# Patient Record
Sex: Female | Born: 1945 | Race: White | Hispanic: No | Marital: Married | Smoking: Former smoker
Health system: Southern US, Community
[De-identification: ages and names within clinical notes are randomized; demographics above are authoritative.]

## PROBLEM LIST (undated history)

## (undated) DIAGNOSIS — E739 Lactose intolerance, unspecified: Secondary | ICD-10-CM

## (undated) DIAGNOSIS — I340 Nonrheumatic mitral (valve) insufficiency: Secondary | ICD-10-CM

## (undated) DIAGNOSIS — I7300 Raynaud's syndrome without gangrene: Secondary | ICD-10-CM

## (undated) DIAGNOSIS — L659 Nonscarring hair loss, unspecified: Secondary | ICD-10-CM

## (undated) DIAGNOSIS — E039 Hypothyroidism, unspecified: Secondary | ICD-10-CM

## (undated) DIAGNOSIS — G35 Multiple sclerosis: Secondary | ICD-10-CM

## (undated) DIAGNOSIS — K589 Irritable bowel syndrome without diarrhea: Secondary | ICD-10-CM

## (undated) HISTORY — PX: CHOLECYSTECTOMY: SHX55

## (undated) HISTORY — PX: ABDOMINAL HYSTERECTOMY: SHX81

## (undated) HISTORY — PX: KNEE SURGERY: SHX244

## (undated) HISTORY — PX: TOTAL HIP ARTHROPLASTY: SHX124

---

## 2009-02-03 ENCOUNTER — Encounter: Admission: RE | Admit: 2009-02-03 | Discharge: 2009-02-03 | Payer: Self-pay | Admitting: Sports Medicine

## 2015-10-17 ENCOUNTER — Emergency Department (INDEPENDENT_AMBULATORY_CARE_PROVIDER_SITE_OTHER)
Admission: EM | Admit: 2015-10-17 | Discharge: 2015-10-17 | Disposition: A | Discharge status: Home / Self Care | Payer: Medicare Other | Source: Home / Self Care | Attending: Family Medicine | Admitting: Family Medicine

## 2015-10-17 ENCOUNTER — Encounter: Payer: Self-pay | Admitting: *Deleted

## 2015-10-17 DIAGNOSIS — N3001 Acute cystitis with hematuria: Secondary | ICD-10-CM

## 2015-10-17 DIAGNOSIS — R300 Dysuria: Secondary | ICD-10-CM | POA: Diagnosis not present

## 2015-10-17 HISTORY — DX: Lactose intolerance, unspecified: E73.9

## 2015-10-17 HISTORY — DX: Hypothyroidism, unspecified: E03.9

## 2015-10-17 HISTORY — DX: Multiple sclerosis: G35

## 2015-10-17 HISTORY — DX: Irritable bowel syndrome, unspecified: K58.9

## 2015-10-17 LAB — POCT URINALYSIS DIP (MANUAL ENTRY)
BILIRUBIN UA: NEGATIVE
BILIRUBIN UA: NEGATIVE
GLUCOSE UA: NEGATIVE
Nitrite, UA: NEGATIVE
PROTEIN UA: NEGATIVE
Spec Grav, UA: <=1.005 (ref 1.005–1.03)
Urobilinogen, UA: 0.2 (ref 0–1)
pH, UA: 6 (ref 5–8)

## 2015-10-17 MED ORDER — CEPHALEXIN 500 MG PO CAPS: 500.0000 mg | ORAL_CAPSULE | Freq: Two times a day (BID) | ORAL | Status: DC

## 2015-10-17 NOTE — Discharge Instructions (Signed)
Please take antibiotics as prescribed and be sure to complete entire course even if you start to feel better to ensure infection does not come back.  You may try over the counter medication called Azo to help with bladder spasms.  This medication can make your urine orange, which is normal.

## 2015-10-17 NOTE — ED Provider Notes (Signed)
CSN: [id number]     Arrival date & time 10/17/15  1908 History   First MD Initiated Contact with Patient 10/17/15 1928     Chief Complaint  Patient presents with  . Dysuria   (Consider location/radiation/quality/duration/timing/severity/associated sxs/prior Treatment) HPI  Anna Carpenter is a 70 y.o. female presenting to UC with c/o 3-4 days of having episode of IBS with related diarrhea and mild diffuse abdominal cramping.  She is also c/o bilateral lower back pain and cloudy urine for about 3-4 days.  Last UTI was a few months ago.  She does not recall the antibiotic she was taking last for her UTI.  She has been taking lomtil for her IBS with moderate relief but has not tried anything for her urine or lower back pain.  Pt concerned she may have a UTI but denies pain with urination. Denies hematuria. Denies fever, chills, n/v/d.    Past Medical History  Diagnosis Date  . Multiple sclerosis (HCC)   . Hypothyroidism   . Lactose intolerance   . IBS (irritable bowel syndrome)    Past Surgical History  Procedure Laterality Date  . Abdominal hysterectomy    . Cholecystectomy    . Knee surgery    . Total hip arthroplasty     Family History  Problem Relation Age of Onset  . Hypertension Mother    Social History  Substance Use Topics  . Smoking status: Never Smoker   . Smokeless tobacco: None  . Alcohol Use: No   OB History    No data available     Review of Systems  Constitutional: Negative for fever and chills.  Gastrointestinal: Positive for diarrhea. Negative for nausea, vomiting, abdominal pain, constipation and blood in stool.  Genitourinary: Positive for urgency and frequency. Negative for dysuria, hematuria, flank pain, decreased urine volume, vaginal bleeding, vaginal discharge and vaginal pain.       "cloudy urine"  Musculoskeletal: Positive for back pain. Negative for myalgias.    Allergies  Gabapentin; Macrobid; Nsaids; Sulfa antibiotics; and Tetracyclines &  related  Home Medications   Prior to Admission medications   Medication Sig Start Date End Date Taking? Authorizing Provider  aspirin 81 MG tablet Take 81 mg by mouth daily.   Yes Historical Provider, MD  Cholecalciferol (VITAMIN D PO) Take by mouth.   Yes Historical Provider, MD  Estradiol (VAGIFEM VA) Place vaginally.   Yes Historical Provider, MD  Interferon Beta-1a (REBIF [place]) Inject into the skin.   Yes Historical Provider, MD  IRON PO Take by mouth.   Yes Historical Provider, MD  levothyroxine (SYNTHROID, LEVOTHROID) 75 MCG tablet Take 75 mcg by mouth daily before breakfast.   Yes Historical Provider, MD  Magnesium 100 MG CAPS Take by mouth.   Yes Historical Provider, MD  omega-3 acid ethyl esters (LOVAZA) 1 g capsule Take by mouth 2 (two) times daily.   Yes Historical Provider, MD  Potassium 99 MG TABS Take by mouth.   Yes Historical Provider, MD  cephALEXin (KEFLEX) 500 MG capsule Take 1 capsule (500 mg total) by mouth 2 (two) times daily. For 7 days 10/17/15   Junius Finner, PA-C   Meds Ordered and Administered this Visit  Medications - No data to display  BP 169/79 mmHg  Pulse 57  Temp(Src) 97.7 F (36.5 C) (Oral)  Resp 16  Ht 5' 0.5" (1.537 m)  Wt 178 lb (80.74 kg)  BMI 34.18 kg/m2  SpO2 98% No data found.   Physical Exam  Constitutional:  She appears well-developed and well-nourished. No distress.  HENT:  Head: Normocephalic and atraumatic.  Mouth/Throat: Oropharynx is clear and moist.  Eyes: Conjunctivae are normal. No scleral icterus.  Neck: Normal range of motion.  Cardiovascular: Normal rate, regular rhythm and normal heart sounds.   Pulmonary/Chest: Effort normal and breath sounds normal. No respiratory distress. She has no wheezes. She has no rales.  Abdominal: Soft. She exhibits no distension and no mass. There is no tenderness. There is no rebound, no guarding and no CVA tenderness.  Musculoskeletal: Normal range of motion.  Neurological: She is alert.   Skin: Skin is warm and dry. She is not diaphoretic.  Nursing note and vitals reviewed.   ED Course  Procedures (including critical care time)  Labs Review Labs Reviewed  POCT URINALYSIS DIP (MANUAL ENTRY) - Abnormal; Notable for the following:    Blood, UA trace-intact (*)    Leukocytes, UA large (3+) (*)    All other components within normal limits  URINE CULTURE    Imaging Review No results found.    MDM   1. Dysuria   2. Acute cystitis with hematuria    Pt c/o lower back pain and darkened urine. Pt concerned for UTI.  UA c/w UTI. Will send culture.  Rx: keflex.  Encouraged to stay well hydrated.  F/u with PCP in 3-4 days if not improving, sooner if worsening. Patient verbalized understanding and agreement with treatment plan.    Junius Finner, PA-C 10/18/15 [phone]

## 2015-10-17 NOTE — ED Notes (Signed)
Pt c/o low back pain and cloudy urine x 3-4 days after having an episode of IBS related diarrhea. Denies dysuria or fever.

## 2015-10-19 ENCOUNTER — Telehealth: Payer: Self-pay | Admitting: Emergency Medicine

## 2015-10-19 LAB — URINE CULTURE: Colony Count: 15000

## 2015-10-19 NOTE — Telephone Encounter (Signed)
Hope she is feeling better, no bacteria to speak of in culture, finish meds and call if not getting better

## 2016-05-07 DIAGNOSIS — G35 Multiple sclerosis: Secondary | ICD-10-CM | POA: Diagnosis not present

## 2016-05-07 DIAGNOSIS — G2581 Restless legs syndrome: Secondary | ICD-10-CM | POA: Diagnosis not present

## 2016-05-07 DIAGNOSIS — R252 Cramp and spasm: Secondary | ICD-10-CM | POA: Diagnosis not present

## 2016-05-07 DIAGNOSIS — R5383 Other fatigue: Secondary | ICD-10-CM | POA: Diagnosis not present

## 2016-05-10 DIAGNOSIS — R9082 White matter disease, unspecified: Secondary | ICD-10-CM | POA: Diagnosis not present

## 2016-05-10 DIAGNOSIS — G35 Multiple sclerosis: Secondary | ICD-10-CM | POA: Diagnosis not present

## 2016-05-15 DIAGNOSIS — B373 Candidiasis of vulva and vagina: Secondary | ICD-10-CM | POA: Diagnosis not present

## 2016-05-17 DIAGNOSIS — R102 Pelvic and perineal pain: Secondary | ICD-10-CM | POA: Diagnosis not present

## 2016-06-07 DIAGNOSIS — G35 Multiple sclerosis: Secondary | ICD-10-CM | POA: Diagnosis not present

## 2016-07-04 DIAGNOSIS — G35 Multiple sclerosis: Secondary | ICD-10-CM | POA: Diagnosis not present

## 2016-07-04 DIAGNOSIS — G2581 Restless legs syndrome: Secondary | ICD-10-CM | POA: Diagnosis not present

## 2016-08-07 DIAGNOSIS — Z0000 Encounter for general adult medical examination without abnormal findings: Secondary | ICD-10-CM | POA: Diagnosis not present

## 2016-08-07 DIAGNOSIS — E782 Mixed hyperlipidemia: Secondary | ICD-10-CM | POA: Diagnosis not present

## 2016-08-07 DIAGNOSIS — E039 Hypothyroidism, unspecified: Secondary | ICD-10-CM | POA: Diagnosis not present

## 2016-08-29 DIAGNOSIS — D692 Other nonthrombocytopenic purpura: Secondary | ICD-10-CM | POA: Diagnosis not present

## 2016-09-20 DIAGNOSIS — N952 Postmenopausal atrophic vaginitis: Secondary | ICD-10-CM | POA: Diagnosis not present

## 2016-09-20 DIAGNOSIS — N760 Acute vaginitis: Secondary | ICD-10-CM | POA: Diagnosis not present

## 2016-09-25 DIAGNOSIS — G35 Multiple sclerosis: Secondary | ICD-10-CM | POA: Diagnosis not present

## 2016-10-04 DIAGNOSIS — R252 Cramp and spasm: Secondary | ICD-10-CM | POA: Diagnosis not present

## 2016-10-04 DIAGNOSIS — G2581 Restless legs syndrome: Secondary | ICD-10-CM | POA: Diagnosis not present

## 2016-10-04 DIAGNOSIS — R5383 Other fatigue: Secondary | ICD-10-CM | POA: Diagnosis not present

## 2016-10-04 DIAGNOSIS — G35 Multiple sclerosis: Secondary | ICD-10-CM | POA: Diagnosis not present

## 2016-10-05 ENCOUNTER — Emergency Department (INDEPENDENT_AMBULATORY_CARE_PROVIDER_SITE_OTHER)
Admission: EM | Admit: 2016-10-05 | Discharge: 2016-10-05 | Disposition: A | Discharge status: Home / Self Care | Payer: Medicare Other | Source: Home / Self Care | Attending: Family Medicine | Admitting: Family Medicine

## 2016-10-05 DIAGNOSIS — R300 Dysuria: Secondary | ICD-10-CM | POA: Diagnosis not present

## 2016-10-05 DIAGNOSIS — R310 Gross hematuria: Secondary | ICD-10-CM | POA: Diagnosis not present

## 2016-10-05 HISTORY — DX: Nonscarring hair loss, unspecified: L65.9

## 2016-10-05 HISTORY — DX: Raynaud's syndrome without gangrene: I73.00

## 2016-10-05 HISTORY — DX: Nonrheumatic mitral (valve) insufficiency: I34.0

## 2016-10-05 LAB — POCT URINALYSIS DIP (MANUAL ENTRY)
Bilirubin, UA: NEGATIVE
Glucose, UA: NEGATIVE mg/dL
Ketones, POC UA: NEGATIVE mg/dL
Nitrite, UA: NEGATIVE
Protein Ur, POC: NEGATIVE mg/dL
Spec Grav, UA: <=1.005 — AB (ref 1.010–1.025)
Urobilinogen, UA: 0.2 E.U./dL
pH, UA: 7 (ref 5.0–8.0)

## 2016-10-05 MED ORDER — CIPROFLOXACIN HCL 250 MG PO TABS: 250.0000 mg | ORAL_TABLET | Freq: Two times a day (BID) | ORAL | 0 refills | Status: DC

## 2016-10-05 NOTE — ED Triage Notes (Signed)
Pt stated that her urine was very dark today, and took a UTI home pregnancy test and it was positive.

## 2016-10-05 NOTE — Discharge Instructions (Signed)
° °  Please let your provider know that your symptoms and urine test in urgent care are consistent with a likely UTI (urinary tract infection) but not definite.    The urine showed Specific Gravity < = 1.005,  Blood= small, Leukocytes= Large, Nitrites= Negative  Your urine has been sent for further testing with a culture.  This will show [place] how many bacteria grow and make sure you are on the correct antibiotic.  Ciprofloxacin is a strong antibiotic for urinary infections.  If you develop joint or muscle pain or swelling, please stop this medication immediately and call our office.

## 2016-10-05 NOTE — ED Provider Notes (Signed)
CSN: [id number]     Arrival date & time 10/05/16  1722 History   First MD Initiated Contact with Patient 10/05/16 1740     Chief Complaint  Patient presents with  . Urinary Tract Infection   (Consider location/radiation/quality/duration/timing/severity/associated sxs/prior Treatment) HPI Anna Carpenter is a 71 y.o. female presenting to UC with c/o dark urine for about 2 days, even darker today than yesterday with mild lower abdominal discomfort.  Home UTI test was Positive.  Pt is concerned because she has a hx of MS and is suppose to get her second infusion of a new medication on Tuesday, 10/08/16 but she will not be able to get it if she has an infection. Denies fever, chills, n/v/d. Denies back pain. Pt notes she drinks at least 64oz water daily so she does not believe she is dehydrated. No hx of kidney problems. Last time she had a UTI several years ago there was blood in her urine.    Past Medical History:  Diagnosis Date  . Alopecia   . Hypothyroidism   . IBS (irritable bowel syndrome)   . Lactose intolerance   . Mitral valve regurgitation   . Multiple sclerosis (HCC)   . Raynaud's disease    Past Surgical History:  Procedure Laterality Date  . ABDOMINAL HYSTERECTOMY    . CHOLECYSTECTOMY    . KNEE SURGERY    . TOTAL HIP ARTHROPLASTY     Family History  Problem Relation Age of Onset  . Hypertension Mother    Social History  Substance Use Topics  . Smoking status: Never Smoker  . Smokeless tobacco: Not on file  . Alcohol use No   OB History    No data available     Review of Systems  Constitutional: Negative for chills and fever.  Gastrointestinal: Negative for nausea and vomiting.  Genitourinary: Positive for dysuria, frequency, hematuria and pelvic pain (mild suprapubic pain). Negative for decreased urine volume, flank pain, urgency, vaginal bleeding, vaginal discharge and vaginal pain.  Musculoskeletal: Negative for back pain and myalgias.    Allergies   Gabapentin; Macrobid [nitrofurantoin monohyd macro]; Nsaids; Sulfa antibiotics; Tetracyclines & related; and Thimerosal  Home Medications   Prior to Admission medications   Medication Sig Start Date End Date Taking? Authorizing Provider  aspirin 81 MG tablet Take 81 mg by mouth daily.    [provider]  cephALEXin (KEFLEX) 500 MG capsule Take 1 capsule (500 mg total) by mouth 2 (two) times daily. For 7 days 10/17/15   Lurene Shadow, PA-C  Cholecalciferol (VITAMIN D PO) Take by mouth.    [provider]  ciprofloxacin (CIPRO) 250 MG tablet Take 1 tablet (250 mg total) by mouth 2 (two) times daily. 10/05/16   Lurene Shadow, PA-C  Estradiol (VAGIFEM VA) Place vaginally.    [provider]  Interferon Beta-1a (REBIF [place]) Inject into the skin.    [provider]  IRON PO Take by mouth.    [provider]  levothyroxine (SYNTHROID, LEVOTHROID) 75 MCG tablet Take 75 mcg by mouth daily before breakfast.    [provider]  Magnesium 100 MG CAPS Take by mouth.    [provider]  omega-3 acid ethyl esters (LOVAZA) 1 g capsule Take by mouth 2 (two) times daily.    [provider]  Potassium 99 MG TABS Take by mouth.    [provider]   Meds Ordered and Administered this Visit  Medications - No data to display  BP (!) 168/77 (BP Location: Left Arm)   Pulse 63   Temp 97.6 F (36.4 C) (Oral)   Ht 5\' 4"  (1.626 m)   Wt 172 lb (78 kg)   SpO2 100%   BMI 29.52 kg/m  No data found.   Physical Exam  Constitutional: She is oriented to person, place, and time. She appears well-developed and well-nourished. No distress.  HENT:  Head: Normocephalic and atraumatic.  Mouth/Throat: Oropharynx is clear and moist.  Eyes: EOM are normal.  Neck: Normal range of motion.  Cardiovascular: Normal rate and regular rhythm.   Pulmonary/Chest: Effort normal and breath sounds normal. No respiratory distress. She has no wheezes. She  has no rales.  Abdominal: Soft. She exhibits no distension and no mass. There is tenderness in the suprapubic area. There is no rebound, no guarding and no CVA tenderness. No hernia.  Musculoskeletal: Normal range of motion.  Neurological: She is alert and oriented to person, place, and time.  Skin: Skin is warm and dry. She is not diaphoretic.  Psychiatric: She has a normal mood and affect. Her behavior is normal.  Nursing note and vitals reviewed.   Urgent Care Course     Procedures (including critical care time)  Labs Review Labs Reviewed  POCT URINALYSIS DIP (MANUAL ENTRY) - Abnormal; Notable for the following:       Result Value   Spec Grav, UA <=1.005 (*)    Blood, UA small (*)    Leukocytes, UA Large (3+) (*)    All other components within normal limits  URINE CULTURE    Imaging Review No results found.    MDM   1. Dysuria   2. Hematuria, gross    Hx and UA c/w mild UTI, will send culture. Due to new for urgent treatment of infection before Tuesday, will start pt on Cipro 250mg  BID for 3 days. Pt has had Cipro before w/o reaction.  Encouraged to let her provider know next week about today's visit as they may want to recheck her urine.  Pt understanding and agreeable with tx plan.     Lurene Shadow, [place] 10/05/16 [phone]

## 2016-10-05 NOTE — ED Notes (Signed)
Pt had a UTI home test, not a UTI home pregnancy test

## 2016-10-07 ENCOUNTER — Telehealth: Payer: Self-pay | Admitting: Emergency Medicine

## 2016-10-07 LAB — URINE CULTURE

## 2016-10-07 NOTE — Telephone Encounter (Signed)
Pt informed of urine culture results, will complete antbs as prescribed and if sxs do not resolve, follow up with PCP.  TMartin,CMA

## 2016-10-08 DIAGNOSIS — H53002 Unspecified amblyopia, left eye: Secondary | ICD-10-CM | POA: Diagnosis not present

## 2016-10-08 DIAGNOSIS — H01004 Unspecified blepharitis left upper eyelid: Secondary | ICD-10-CM | POA: Diagnosis not present

## 2016-10-08 DIAGNOSIS — H527 Unspecified disorder of refraction: Secondary | ICD-10-CM | POA: Diagnosis not present

## 2016-10-08 DIAGNOSIS — H25813 Combined forms of age-related cataract, bilateral: Secondary | ICD-10-CM | POA: Diagnosis not present

## 2016-10-08 DIAGNOSIS — H469 Unspecified optic neuritis: Secondary | ICD-10-CM | POA: Diagnosis not present

## 2016-10-09 DIAGNOSIS — G35 Multiple sclerosis: Secondary | ICD-10-CM | POA: Diagnosis not present

## 2016-12-31 DIAGNOSIS — G35 Multiple sclerosis: Secondary | ICD-10-CM | POA: Diagnosis not present

## 2016-12-31 DIAGNOSIS — G2581 Restless legs syndrome: Secondary | ICD-10-CM | POA: Diagnosis not present

## 2016-12-31 DIAGNOSIS — R5383 Other fatigue: Secondary | ICD-10-CM | POA: Diagnosis not present

## 2016-12-31 DIAGNOSIS — R252 Cramp and spasm: Secondary | ICD-10-CM | POA: Diagnosis not present

## 2017-01-02 DIAGNOSIS — Z23 Encounter for immunization: Secondary | ICD-10-CM | POA: Diagnosis not present

## 2017-02-05 DIAGNOSIS — N952 Postmenopausal atrophic vaginitis: Secondary | ICD-10-CM | POA: Diagnosis not present

## 2017-02-05 DIAGNOSIS — N898 Other specified noninflammatory disorders of vagina: Secondary | ICD-10-CM | POA: Diagnosis not present

## 2017-02-05 DIAGNOSIS — B373 Candidiasis of vulva and vagina: Secondary | ICD-10-CM | POA: Diagnosis not present

## 2017-02-07 DIAGNOSIS — Z0000 Encounter for general adult medical examination without abnormal findings: Secondary | ICD-10-CM | POA: Diagnosis not present

## 2017-02-07 DIAGNOSIS — E039 Hypothyroidism, unspecified: Secondary | ICD-10-CM | POA: Diagnosis not present

## 2017-02-07 DIAGNOSIS — E782 Mixed hyperlipidemia: Secondary | ICD-10-CM | POA: Diagnosis not present

## 2017-02-13 DIAGNOSIS — Z0000 Encounter for general adult medical examination without abnormal findings: Secondary | ICD-10-CM | POA: Diagnosis not present

## 2017-02-13 DIAGNOSIS — E782 Mixed hyperlipidemia: Secondary | ICD-10-CM | POA: Diagnosis not present

## 2017-02-13 DIAGNOSIS — E039 Hypothyroidism, unspecified: Secondary | ICD-10-CM | POA: Diagnosis not present

## 2017-02-13 DIAGNOSIS — Z23 Encounter for immunization: Secondary | ICD-10-CM | POA: Diagnosis not present

## 2017-02-25 DIAGNOSIS — Z1231 Encounter for screening mammogram for malignant neoplasm of breast: Secondary | ICD-10-CM | POA: Diagnosis not present

## 2017-03-06 DIAGNOSIS — N6092 Unspecified benign mammary dysplasia of left breast: Secondary | ICD-10-CM | POA: Diagnosis not present

## 2017-03-06 DIAGNOSIS — R5383 Other fatigue: Secondary | ICD-10-CM | POA: Diagnosis not present

## 2017-03-06 DIAGNOSIS — R928 Other abnormal and inconclusive findings on diagnostic imaging of breast: Secondary | ICD-10-CM | POA: Diagnosis not present

## 2017-03-06 DIAGNOSIS — G35 Multiple sclerosis: Secondary | ICD-10-CM | POA: Diagnosis not present

## 2017-03-06 DIAGNOSIS — G2581 Restless legs syndrome: Secondary | ICD-10-CM | POA: Diagnosis not present

## 2017-03-26 DIAGNOSIS — G35 Multiple sclerosis: Secondary | ICD-10-CM | POA: Diagnosis not present

## 2017-05-17 DIAGNOSIS — B373 Candidiasis of vulva and vagina: Secondary | ICD-10-CM | POA: Diagnosis not present

## 2017-06-06 DIAGNOSIS — G2581 Restless legs syndrome: Secondary | ICD-10-CM | POA: Diagnosis not present

## 2017-06-06 DIAGNOSIS — G35 Multiple sclerosis: Secondary | ICD-10-CM | POA: Diagnosis not present

## 2017-06-06 DIAGNOSIS — R5383 Other fatigue: Secondary | ICD-10-CM | POA: Diagnosis not present

## 2017-08-08 DIAGNOSIS — E782 Mixed hyperlipidemia: Secondary | ICD-10-CM | POA: Diagnosis not present

## 2017-08-08 DIAGNOSIS — E039 Hypothyroidism, unspecified: Secondary | ICD-10-CM | POA: Diagnosis not present

## 2017-08-08 DIAGNOSIS — M899 Disorder of bone, unspecified: Secondary | ICD-10-CM | POA: Diagnosis not present

## 2017-08-09 DIAGNOSIS — H01004 Unspecified blepharitis left upper eyelid: Secondary | ICD-10-CM | POA: Diagnosis not present

## 2017-08-09 DIAGNOSIS — H53002 Unspecified amblyopia, left eye: Secondary | ICD-10-CM | POA: Diagnosis not present

## 2017-08-09 DIAGNOSIS — H01001 Unspecified blepharitis right upper eyelid: Secondary | ICD-10-CM | POA: Diagnosis not present

## 2017-08-09 DIAGNOSIS — H43813 Vitreous degeneration, bilateral: Secondary | ICD-10-CM | POA: Diagnosis not present

## 2017-08-09 DIAGNOSIS — H527 Unspecified disorder of refraction: Secondary | ICD-10-CM | POA: Diagnosis not present

## 2017-08-09 DIAGNOSIS — H25813 Combined forms of age-related cataract, bilateral: Secondary | ICD-10-CM | POA: Diagnosis not present

## 2017-08-09 DIAGNOSIS — H469 Unspecified optic neuritis: Secondary | ICD-10-CM | POA: Diagnosis not present

## 2017-08-09 DIAGNOSIS — H52223 Regular astigmatism, bilateral: Secondary | ICD-10-CM | POA: Diagnosis not present

## 2017-08-14 DIAGNOSIS — E782 Mixed hyperlipidemia: Secondary | ICD-10-CM | POA: Diagnosis not present

## 2017-08-14 DIAGNOSIS — M25541 Pain in joints of right hand: Secondary | ICD-10-CM | POA: Diagnosis not present

## 2017-08-14 DIAGNOSIS — E039 Hypothyroidism, unspecified: Secondary | ICD-10-CM | POA: Diagnosis not present

## 2017-08-15 DIAGNOSIS — H25813 Combined forms of age-related cataract, bilateral: Secondary | ICD-10-CM | POA: Diagnosis not present

## 2017-08-15 DIAGNOSIS — G2581 Restless legs syndrome: Secondary | ICD-10-CM | POA: Diagnosis not present

## 2017-08-15 DIAGNOSIS — H52223 Regular astigmatism, bilateral: Secondary | ICD-10-CM | POA: Diagnosis not present

## 2017-08-15 DIAGNOSIS — H01001 Unspecified blepharitis right upper eyelid: Secondary | ICD-10-CM | POA: Diagnosis not present

## 2017-08-15 DIAGNOSIS — H43813 Vitreous degeneration, bilateral: Secondary | ICD-10-CM | POA: Diagnosis not present

## 2017-08-15 DIAGNOSIS — H53002 Unspecified amblyopia, left eye: Secondary | ICD-10-CM | POA: Diagnosis not present

## 2017-08-15 DIAGNOSIS — H469 Unspecified optic neuritis: Secondary | ICD-10-CM | POA: Diagnosis not present

## 2017-08-15 DIAGNOSIS — K219 Gastro-esophageal reflux disease without esophagitis: Secondary | ICD-10-CM | POA: Diagnosis not present

## 2017-08-15 DIAGNOSIS — H01004 Unspecified blepharitis left upper eyelid: Secondary | ICD-10-CM | POA: Diagnosis not present

## 2017-08-15 DIAGNOSIS — E039 Hypothyroidism, unspecified: Secondary | ICD-10-CM | POA: Diagnosis not present

## 2017-08-15 DIAGNOSIS — H527 Unspecified disorder of refraction: Secondary | ICD-10-CM | POA: Diagnosis not present

## 2017-08-15 DIAGNOSIS — H25812 Combined forms of age-related cataract, left eye: Secondary | ICD-10-CM | POA: Diagnosis not present

## 2017-08-15 DIAGNOSIS — G35 Multiple sclerosis: Secondary | ICD-10-CM | POA: Diagnosis not present

## 2017-08-15 DIAGNOSIS — H52222 Regular astigmatism, left eye: Secondary | ICD-10-CM | POA: Diagnosis not present

## 2017-08-16 DIAGNOSIS — Z961 Presence of intraocular lens: Secondary | ICD-10-CM | POA: Diagnosis not present

## 2017-08-20 DIAGNOSIS — Z961 Presence of intraocular lens: Secondary | ICD-10-CM | POA: Diagnosis not present

## 2017-08-30 DIAGNOSIS — H538 Other visual disturbances: Secondary | ICD-10-CM | POA: Diagnosis not present

## 2017-09-04 DIAGNOSIS — H538 Other visual disturbances: Secondary | ICD-10-CM | POA: Diagnosis not present

## 2017-09-04 DIAGNOSIS — Z961 Presence of intraocular lens: Secondary | ICD-10-CM | POA: Diagnosis not present

## 2017-09-12 DIAGNOSIS — H0100B Unspecified blepharitis left eye, upper and lower eyelids: Secondary | ICD-10-CM | POA: Diagnosis not present

## 2017-09-12 DIAGNOSIS — H2640 Unspecified secondary cataract: Secondary | ICD-10-CM | POA: Diagnosis not present

## 2017-09-12 DIAGNOSIS — Z961 Presence of intraocular lens: Secondary | ICD-10-CM | POA: Diagnosis not present

## 2017-09-12 DIAGNOSIS — H0100A Unspecified blepharitis right eye, upper and lower eyelids: Secondary | ICD-10-CM | POA: Diagnosis not present

## 2017-09-12 DIAGNOSIS — E039 Hypothyroidism, unspecified: Secondary | ICD-10-CM | POA: Diagnosis not present

## 2017-09-12 DIAGNOSIS — G35 Multiple sclerosis: Secondary | ICD-10-CM | POA: Diagnosis not present

## 2017-09-12 DIAGNOSIS — E782 Mixed hyperlipidemia: Secondary | ICD-10-CM | POA: Diagnosis not present

## 2017-09-27 DIAGNOSIS — Z961 Presence of intraocular lens: Secondary | ICD-10-CM | POA: Diagnosis not present

## 2017-09-27 DIAGNOSIS — H16223 Keratoconjunctivitis sicca, not specified as Sjogren's, bilateral: Secondary | ICD-10-CM | POA: Diagnosis not present

## 2017-09-30 DIAGNOSIS — G35 Multiple sclerosis: Secondary | ICD-10-CM | POA: Diagnosis not present

## 2017-10-17 ENCOUNTER — Encounter: Payer: Self-pay | Admitting: Sports Medicine

## 2017-10-17 ENCOUNTER — Ambulatory Visit (INDEPENDENT_AMBULATORY_CARE_PROVIDER_SITE_OTHER): Payer: Medicare Other

## 2017-10-17 ENCOUNTER — Ambulatory Visit (INDEPENDENT_AMBULATORY_CARE_PROVIDER_SITE_OTHER): Payer: Medicare Other | Admitting: Sports Medicine

## 2017-10-17 DIAGNOSIS — M25511 Pain in right shoulder: Principal | ICD-10-CM

## 2017-10-17 DIAGNOSIS — M47812 Spondylosis without myelopathy or radiculopathy, cervical region: Secondary | ICD-10-CM | POA: Insufficient documentation

## 2017-10-17 DIAGNOSIS — M5412 Radiculopathy, cervical region: Secondary | ICD-10-CM

## 2017-10-17 DIAGNOSIS — M4313 Spondylolisthesis, cervicothoracic region: Secondary | ICD-10-CM

## 2017-10-17 DIAGNOSIS — G35D Multiple sclerosis, unspecified: Secondary | ICD-10-CM | POA: Insufficient documentation

## 2017-10-17 DIAGNOSIS — G8929 Other chronic pain: Secondary | ICD-10-CM

## 2017-10-17 DIAGNOSIS — G35 Multiple sclerosis: Secondary | ICD-10-CM | POA: Diagnosis not present

## 2017-10-17 MED ORDER — DICLOFENAC SODIUM 2 % TD SOLN: 2.0000 | Freq: Two times a day (BID) | TRANSDERMAL | 11 refills | Status: DC

## 2017-10-17 NOTE — Progress Notes (Signed)
Subjective:    CC: Shoulder and neck pain  HPI:  Neck pain: Worse with prolonged downgaze, no radiation into the arms or fingertips, no progressive weakness, moderate, persistent with a grinding sensation.  Allergic to NSAIDs, desires to try more of a nonpharmacologic approach.  Shoulder pain: Anterior, popping sensations are common, worse with reaching forward.  No trauma, pain is chronic.  I reviewed the past medical history, family history, social history, surgical history, and allergies today and no changes were needed.  Please see the problem list section below in epic for further details.  Past Medical History: Past Medical History:  Diagnosis Date  . Alopecia   . Hypothyroidism   . IBS (irritable bowel syndrome)   . Lactose intolerance   . Mitral valve regurgitation   . Multiple sclerosis (HCC)   . Raynaud's disease    Past Surgical History: Past Surgical History:  Procedure Laterality Date  . ABDOMINAL HYSTERECTOMY    . CHOLECYSTECTOMY    . KNEE SURGERY    . TOTAL HIP ARTHROPLASTY     Social History: Social History   Socioeconomic History  . Marital status: Married    Spouse name: Not on file  . Number of children: Not on file  . Years of education: Not on file  . Highest education level: Not on file  Occupational History  . Not on file  Social Needs  . Financial resource strain: Not on file  . Food insecurity:    Worry: Not on file    Inability: Not on file  . Transportation needs:    Medical: Not on file    Non-medical: Not on file  Tobacco Use  . Smoking status: Former Games developer  . Smokeless tobacco: Never Used  Substance and Sexual Activity  . Alcohol use: No  . Drug use: No  . Sexual activity: Not Currently  Lifestyle  . Physical activity:    Days per week: Not on file    Minutes per session: Not on file  . Stress: Not on file  Relationships  . Social connections:    Talks on phone: Not on file    Gets together: Not on file    Attends  religious service: Not on file    Active member of club or organization: Not on file    Attends meetings of clubs or organizations: Not on file    Relationship status: Not on file  Other Topics Concern  . Not on file  Social History Narrative  . Not on file   Family History: Family History  Problem Relation Age of Onset  . Hypertension Mother   . Cancer Mother        Ovarian   Allergies: Allergies  Allergen Reactions  . Gabapentin   . Macrobid [employer] Macro]   . Nsaids   . Sulfa Antibiotics   . Tetracyclines & Related   . Thimerosal    Medications: See med rec.  Review of Systems: No headache, visual changes, nausea, vomiting, diarrhea, constipation, dizziness, abdominal pain, skin rash, fevers, chills, night sweats, swollen lymph nodes, weight loss, chest pain, body aches, joint swelling, muscle aches, shortness of breath, mood changes, visual or auditory hallucinations.  Objective:    General: Well Developed, well nourished, and in no acute distress.  Neuro: Alert and oriented x3, extra-ocular muscles intact, sensation grossly intact.  HEENT: Normocephalic, atraumatic, pupils equal round reactive to light, neck supple, no masses, no lymphadenopathy, thyroid nonpalpable.  Skin: Warm and dry, no rashes noted.  Cardiac: Regular rate and rhythm, no murmurs rubs or gallops.  Respiratory: Clear to auscultation bilaterally. Not using accessory muscles, speaking in full sentences.  Abdominal: Soft, nontender, nondistended, positive bowel sounds, no masses, no organomegaly.  Neck: Negative spurling's Full neck range of motion Grip strength and sensation normal in bilateral hands Strength good C4 to T1 distribution No sensory change to C4 to T1 Reflexes normal Right shoulder: Inspection reveals no abnormalities, atrophy or asymmetry. Tender to palpation over the bicipital groove. ROM is full in all planes. Rotator cuff strength normal throughout. No signs of  impingement with negative Neer and Hawkin's tests, empty can. Positive speeds and Yergason tests No labral pathology noted with negative Obrien's, negative crank, negative clunk, and good stability. Normal scapular function observed. No painful arc and no drop arm sign. No apprehension sign  Impression and Recommendations:    The patient was counselled, risk factors were discussed, anticipatory guidance given.  Chronic right shoulder pain Clinically represents biceps tendon subluxation and tendinitis at the groove. X-rays, formal physical therapy.   Topical Pennsaid (diclofenac 2% topical). She does not desire to avoid a pharmacologic approach. Return in 6 weeks.  Spondylosis of cervical spine Discogenic pain without radiculopathy, myelopathy. I do not see any need to proceed with a cervical spine MRI at this point in spite of her MS. Desires to avoid a pharmacologic approach, adding topical Pennsaid (diclofenac 2% topical), aggressive formal physical therapy, x-rays. Return in 1 month, we will probably discuss medications at that point. ___________________________________________ Ihor Austin. Benjamin Stain, M.D., ABFM., CAQSM. Primary Care and Sports Medicine [facility]  Adjunct Instructor of Family Medicine  [employer]

## 2017-10-17 NOTE — Assessment & Plan Note (Signed)
Clinically represents biceps tendon subluxation and tendinitis at the groove. X-rays, formal physical therapy.   Topical Pennsaid (diclofenac 2% topical). She does not desire to avoid a pharmacologic approach. Return in 6 weeks.

## 2017-10-17 NOTE — Assessment & Plan Note (Signed)
Discogenic pain without radiculopathy, myelopathy. I do not see any need to proceed with a cervical spine MRI at this point in spite of her MS. Desires to avoid a pharmacologic approach, adding topical Pennsaid (diclofenac 2% topical), aggressive formal physical therapy, x-rays. Return in 1 month, we will probably discuss medications at that point.

## 2017-10-18 ENCOUNTER — Encounter: Payer: Self-pay | Admitting: Physical Therapy

## 2017-10-18 ENCOUNTER — Ambulatory Visit (INDEPENDENT_AMBULATORY_CARE_PROVIDER_SITE_OTHER): Payer: Medicare Other | Admitting: Physical Therapy

## 2017-10-18 DIAGNOSIS — G8929 Other chronic pain: Secondary | ICD-10-CM

## 2017-10-18 DIAGNOSIS — G35 Multiple sclerosis: Secondary | ICD-10-CM | POA: Diagnosis not present

## 2017-10-18 DIAGNOSIS — M47812 Spondylosis without myelopathy or radiculopathy, cervical region: Secondary | ICD-10-CM | POA: Diagnosis not present

## 2017-10-18 DIAGNOSIS — M25511 Pain in right shoulder: Secondary | ICD-10-CM

## 2017-10-18 NOTE — Patient Instructions (Signed)

## 2017-10-18 NOTE — Therapy (Signed)
[facility] [address] [place], [place], [address] Phone: [phone]   Fax:  [phone]  Physical Therapy Evaluation  Patient Details  Name: Anna Carpenter MRN: [id number] Date of Birth: [birth year] Referring Provider: T. Thekkekandem   Encounter Date: 10/18/2017  PT End of Session - 10/18/17 1237    Visit Number  1    Number of Visits  12    Date for PT Re-Evaluation  11/29/17    Activity Tolerance  Patient tolerated treatment well       Past Medical History:  Diagnosis Date  . Alopecia   . Hypothyroidism   . IBS (irritable bowel syndrome)   . Lactose intolerance   . Mitral valve regurgitation   . Multiple sclerosis (HCC)   . Raynaud's disease     Past Surgical History:  Procedure Laterality Date  . ABDOMINAL HYSTERECTOMY    . CHOLECYSTECTOMY    . KNEE SURGERY    . TOTAL HIP ARTHROPLASTY      There were no vitals filed for this visit.   Subjective Assessment - 10/18/17 1234    Subjective  Pt relays 10/06/17 she woke up and had severe pain and stiffness and could feel "crunch' in her neck. The pain has become some better but is still bothering her. She is allergic to NSAIDs, and she does have MS relaspe-remitting so she prefers mornings. Her Rt shoulder has had pain for about a year anteriorly with popping sensation when she reaches.     Pertinent History  Hx of MS, THA, allergic to NSAIDS    Diagnostic tests  C spine X-rays "Multilevel osteoarthritic change. Mild spondylolisthesis at C7-T1, likely due to underlying spondylosis. No other spondylolisthesis. Rt shoulder x ray show no fracture but Mild osteoarthritic changes of the glenohumeral and    Patient Stated Goals  stop the popping in her Rt shoulder    Currently in Pain?  Yes    Pain Score  7     Pain Location  Shoulder    Pain Orientation  Right    Pain Descriptors / Indicators  Sharp;Shooting    Pain Type  Chronic pain    Pain Onset  More than  a month ago    Pain Frequency  Constant    Aggravating Factors   reaching out or up    Pain Relieving Factors  rest    Multiple Pain Sites  Yes    Pain Score  5    Pain Location  Neck    Pain Orientation  Right;Left    Pain Type  Chronic pain         [facility] PT Assessment - 10/18/17 0001      Assessment   Medical Diagnosis  Chronic neck (spondylosis) and R shoulder pain    Referring Provider  T. Thekkekandem    Onset Date/Surgical Date  -- chronic neck pain 10 years and Rt shlder pain 1 year    Next MD Visit  6 weeks    Prior Therapy  PT for LE in past, none for UE      Precautions   Precautions  None      Balance Screen   Has the patient fallen in the past 6 months  No      Prior Function   Level of Independence  Independent with basic ADLs    Vocation  Retired    Leisure  playing cards      Cognition   Overall Cognitive Status  Within Functional Limits for tasks assessed      Observation/Other Assessments   Focus on Therapeutic Outcomes (FOTO)   Next visit      Sensation   Light Touch  Appears Intact      Coordination   Gross Motor Movements are Fluid and Coordinated  Yes      Posture/Postural Control   Posture Comments  fwd head, rounded shouldersincreased T kyphosis      ROM / Strength   AROM / PROM / Strength  AROM;Strength      AROM   AROM Assessment Site  Shoulder;Cervical    Right/Left Shoulder  Right    Right Shoulder Flexion  120 Degrees    Right Shoulder ABduction  95 Degrees    Right Shoulder Internal Rotation  -- L4    Right Shoulder External Rotation  -- C7    Cervical Flexion  50%    Cervical Extension  25%    Cervical - Right Side Bend  25%    Cervical - Left Side Bend  25%    Cervical - Right Rotation  50%    Cervical - Left Rotation  50%      Strength   Strength Assessment Site  Shoulder    Right/Left Shoulder  Right    Right Shoulder Flexion  4-/5    Right Shoulder ABduction  4-/5    Right Shoulder Internal Rotation  4+/5     Right Shoulder External Rotation  4-/5      Flexibility   Soft Tissue Assessment /Muscle Length  -- Tightness in Upper trap, pecs, bicep, cervical P.S      Palpation   Palpation comment  TTP localized to biceps tendon and anterior shoulder      Special Tests    Special Tests  -- negative spurlings test, positive impingement tests    Other special tests  Speeds and Obrien test + but neg crank/clunk test    Biceps/Labral tests  --      O'Brien's Test   Findings  --    Side  --                Objective measurements completed on examination: See above findings.      [facility] Adult PT Treatment/Exercise - 10/18/17 0001      Exercises   Exercises  Neck;Shoulder;Elbow      Elbow Exercises   Elbow Flexion  -- Eccentric bicep curl no weight 5 sec X 5      Neck Exercises: Seated   Neck Retraction  10 reps      Shoulder Exercises: Standing   External Rotation  -- next time    Internal Rotation  -- next time      Modalities   Modalities  Iontophoresis;Cryotherapy;Electrical Stimulation;Moist Heat      Moist Heat Therapy   Number Minutes Moist Heat  15 Minutes    Moist Heat Location  Cervical      Cryotherapy   Number Minutes Cryotherapy  15 Minutes    Cryotherapy Location  Shoulder    Type of Cryotherapy  Ice pack      Electrical Stimulation   Electrical Stimulation Location  Rt ant shoulder and bilat neck    Electrical Stimulation Action  IFC    Electrical Stimulation Parameters  to tolerance    Electrical Stimulation Goals  Pain      Iontophoresis   Type of Iontophoresis  Dexamethasone    Location  Rt anterior shoulder  Dose  80 mA    Time  take home patch 14 hr      Neck Exercises: Stretches   Upper Trapezius Stretch  Right;Left;1 rep;30 seconds    Levator Stretch  -- next time    Chest Stretch  -- doorway 1 low 1 high 30 sec             PT Education - 10/18/17 1236    Education Details  HEP, TENS, POC    Person(s) Educated  Patient     Methods  Explanation;Demonstration;Verbal cues;Handout    Comprehension  Verbalized understanding;Need further instruction          PT Long Term Goals - 10/18/17 1242      PT LONG TERM GOAL #1   Title  Pt will be I and compliant with HEP. 6 weeks 11/29/17      PT LONG TERM GOAL #2   Title  Pt will improve cervical and Rt shoulder AROM to [facility] to perform reaching. 6 weeks 11/29/17      PT LONG TERM GOAL #3   Title  Pt will improve Rt shoulder strength to at least 4+/5 to increase funciton. 6 weeks 11/29/17      PT LONG TERM GOAL #4   Title  Pt will relay overall less than 3/10 neck/shoulder pain with all ADL's. 6 weeks 11/29/17             Plan - 10/18/17 1238    Clinical Impression Statement  Pt presents with chronic R shldr pain and biceps tendonitis and chronic bilat neck pain without radiculopathhy and with spondylosis. She has decresed overall neck and Rt shoulder AROM and strength and increased pain which limit her funcitonal abilties. She will benefit from PT to address these deficits. She does have MS and experiences fatique so she prefers morning sessions. She was trialed wiht modalites today to decrease pain and inflammaiton and given HEP consisting of ROM and gentle strengthening to begin.    Clinical Presentation  Evolving    Clinical Decision Making  Moderate    Rehab Potential  Good    Clinical Impairments Affecting Rehab Potential  MS    PT Frequency  2x / week    PT Duration  6 weeks    PT Treatment/Interventions  ADLs/Self Care Home Management;Cryotherapy;Electrical Stimulation;Iontophoresis 4mg /ml Dexamethasone;Moist Heat;Traction;Ultrasound;Therapeutic exercise;Neuromuscular re-education;Manual techniques;Passive range of motion;Dry needling    PT Next Visit Plan  review HEP, assess response to modalites, Do FOTO    Consulted and Agree with Plan of Care  Patient       Patient will benefit from skilled therapeutic intervention in order to improve the following  deficits and impairments:  Decreased activity tolerance, Decreased endurance, Decreased strength, Hypomobility, Increased muscle spasms, Impaired flexibility, Postural dysfunction, Pain  Visit Diagnosis: Chronic right shoulder pain  Spondylosis of cervical region without myelopathy or radiculopathy  Multiple sclerosis [facility])     Problem List Patient Active Problem List   Diagnosis Date Noted  . Multiple sclerosis (HCC) 10/17/2017  . Spondylosis of cervical spine 10/17/2017  . Chronic right shoulder pain 10/17/2017    April Manson, PT, DPT 10/18/2017, 12:48 PM  [facility] [address] [place], [place], [address] Phone: [phone]   Fax:  [phone]  Name: Anna Carpenter MRN: [id number] Date of Birth: 1946/02/11

## 2017-10-22 ENCOUNTER — Encounter: Payer: Self-pay | Admitting: Physical Therapy

## 2017-10-22 ENCOUNTER — Ambulatory Visit (INDEPENDENT_AMBULATORY_CARE_PROVIDER_SITE_OTHER): Payer: Medicare Other | Admitting: Physical Therapy

## 2017-10-22 DIAGNOSIS — M25511 Pain in right shoulder: Secondary | ICD-10-CM

## 2017-10-22 DIAGNOSIS — M47812 Spondylosis without myelopathy or radiculopathy, cervical region: Secondary | ICD-10-CM

## 2017-10-22 DIAGNOSIS — G35 Multiple sclerosis: Secondary | ICD-10-CM | POA: Diagnosis not present

## 2017-10-22 DIAGNOSIS — G8929 Other chronic pain: Secondary | ICD-10-CM

## 2017-10-22 NOTE — Therapy (Signed)
[facility] [place] [address] [place], [place], [address] Phone: [phone]   Fax:  [phone]  Physical Therapy Treatment  Patient Details  Name: Anna Carpenter MRN: [id number] Date of Birth: 08/01/1945 Referring Provider: T. Thekkekandem   Encounter Date: 10/22/2017  PT End of Session - 10/22/17 1006    Visit Number  2    Number of Visits  12    Date for PT Re-Evaluation  11/29/17    Activity Tolerance  Patient tolerated treatment well       Past Medical History:  Diagnosis Date  . Alopecia   . Hypothyroidism   . IBS (irritable bowel syndrome)   . Lactose intolerance   . Mitral valve regurgitation   . Multiple sclerosis (HCC)   . Raynaud's disease     Past Surgical History:  Procedure Laterality Date  . ABDOMINAL HYSTERECTOMY    . CHOLECYSTECTOMY    . KNEE SURGERY    . TOTAL HIP ARTHROPLASTY      There were no vitals filed for this visit.  Subjective Assessment - 10/22/17 0957    Subjective  Pt relays her shoulder feels better and she feels IONTO really helped    Pertinent History  Hx of MS, THA, allergic to NSAIDS    Diagnostic tests  C spine X-rays "Multilevel osteoarthritic change. Mild spondylolisthesis at C7-T1, likely due to underlying spondylosis. No other spondylolisthesis. Rt shoulder x ray show no fracture but Mild osteoarthritic changes of the glenohumeral and    Patient Stated Goals  stop the popping in her Rt shoulder    Currently in Pain?  Yes    Pain Score  4     Pain Location  Shoulder    Pain Orientation  Right    Pain Descriptors / Indicators  Sharp    Pain Type  Chronic pain    Pain Onset  More than a month ago    Pain Frequency  Intermittent    Aggravating Factors   reaching    Pain Relieving Factors  rest                       [facility] Adult PT Treatment/Exercise - 10/22/17 0001      Elbow Exercises   Elbow Flexion  Strengthening;10 reps 1 lb eccentric 5 sec  lower      Shoulder Exercises: Supine   External Rotation  10 reps;AAROM cNW    Flexion  AAROM;10 reps    ABduction  AAROM;10 reps      Shoulder Exercises: Standing   External Rotation  Strengthening;15 reps;Right yellow    Internal Rotation  Strengthening;Right;15 reps yellow      Shoulder Exercises: Pulleys   Flexion  2 minutes    Scaption  2 minutes      Modalities   Modalities  Iontophoresis;Moist Heat;Electrical Stimulation;Ultrasound      Moist Heat Therapy   Number Minutes Moist Heat  15 Minutes    Moist Heat Location  Shoulder;Cervical      Electrical Stimulation   Electrical Stimulation Location  Rt ant shoulder and bilat neck    Electrical Stimulation Action  IFC    Electrical Stimulation Parameters  to tolerance    Electrical Stimulation Goals  Pain      Ultrasound   Ultrasound Location  Rt ant shoulder    Ultrasound Parameters  1.0, 1.0, 8 min    Ultrasound Goals  Pain      Iontophoresis  Type of Iontophoresis  Dexamethasone    Location  Rt anterior shoulder    Dose  80 mA    Time  take home patch 14 hr      Neck Exercises: Stretches   Chest Stretch  2 reps;30 seconds doorway low and mid             PT Education - 10/22/17 1006    Education Details  new exercises, and TENS edu    Person(s) Educated  Patient    Methods  Explanation;Demonstration;Verbal cues    Comprehension  Verbalized understanding;Returned demonstration          PT Long Term Goals - 10/22/17 1009      PT LONG TERM GOAL #1   Title  Pt will be I and compliant with HEP. 6 weeks 11/29/17    Status  On-going      PT LONG TERM GOAL #2   Title  Pt will improve cervical and Rt shoulder AROM to [facility] to perform reaching. 6 weeks 11/29/17    Status  On-going      PT LONG TERM GOAL #3   Title  Pt will improve Rt shoulder strength to at least 4+/5 to increase funciton. 6 weeks 11/29/17    Status  On-going      PT LONG TERM GOAL #4   Title  Pt will relay overall less than 3/10  neck/shoulder pain with all ADL's. 6 weeks 11/29/17    Status  On-going            Plan - 10/22/17 1006    Clinical Impression Statement  Pt had good response to PT and had decreased pain with increased AROM. She shows good effort with HEP and continues to show positive return from modalites. Pt will continue to benefit from PT    Rehab Potential  Good    Clinical Impairments Affecting Rehab Potential  MS    PT Frequency  2x / week    PT Duration  6 weeks    PT Treatment/Interventions  ADLs/Self Care Home Management;Cryotherapy;Electrical Stimulation;Iontophoresis 4mg /ml Dexamethasone;Moist Heat;Traction;Ultrasound;Therapeutic exercise;Neuromuscular re-education;Manual techniques;Passive range of motion;Dry needling    PT Next Visit Plan  progress strenthening an streching as tolerated, modalites PRN       Patient will benefit from skilled therapeutic intervention in order to improve the following deficits and impairments:  Decreased activity tolerance, Decreased endurance, Decreased strength, Hypomobility, Increased muscle spasms, Impaired flexibility, Postural dysfunction, Pain  Visit Diagnosis: Chronic right shoulder pain  Spondylosis of cervical region without myelopathy or radiculopathy  Multiple sclerosis [facility])     Problem List Patient Active Problem List   Diagnosis Date Noted  . Multiple sclerosis (HCC) 10/17/2017  . Spondylosis of cervical spine 10/17/2017  . Chronic right shoulder pain 10/17/2017    April Manson, PT, DPT 10/22/2017, 10:11 AM  [facility] [address] [place], [place], [address] Phone: [phone]   Fax:  [phone]  Name: Ahlayla Buggs MRN: [id number] Date of Birth: [birth year]

## 2017-10-24 ENCOUNTER — Ambulatory Visit (INDEPENDENT_AMBULATORY_CARE_PROVIDER_SITE_OTHER): Payer: Medicare Other | Admitting: Physical Therapy

## 2017-10-24 ENCOUNTER — Encounter: Payer: Self-pay | Admitting: Physical Therapy

## 2017-10-24 DIAGNOSIS — M25511 Pain in right shoulder: Secondary | ICD-10-CM | POA: Diagnosis not present

## 2017-10-24 DIAGNOSIS — G8929 Other chronic pain: Secondary | ICD-10-CM | POA: Diagnosis not present

## 2017-10-24 DIAGNOSIS — M47812 Spondylosis without myelopathy or radiculopathy, cervical region: Secondary | ICD-10-CM | POA: Diagnosis not present

## 2017-10-24 DIAGNOSIS — G35 Multiple sclerosis: Secondary | ICD-10-CM | POA: Diagnosis not present

## 2017-10-24 NOTE — Therapy (Signed)
[facility] [place] [address] [place], [place], [address] Phone: [phone]   Fax:  [phone]  Physical Therapy Treatment  Patient Details  Name: Anna Carpenter MRN: [id number] Date of Birth: [birth year] Referring Provider: T. Thekkekandem   Encounter Date: 10/24/2017  PT End of Session - 10/24/17 1608    Visit Number  3    Number of Visits  12    Date for PT Re-Evaluation  11/29/17    PT Start Time  1400    PT Stop Time  1453    PT Time Calculation (min)  53 min    Activity Tolerance  Patient tolerated treatment well       Past Medical History:  Diagnosis Date  . Alopecia   . Hypothyroidism   . IBS (irritable bowel syndrome)   . Lactose intolerance   . Mitral valve regurgitation   . Multiple sclerosis (HCC)   . Raynaud's disease     Past Surgical History:  Procedure Laterality Date  . ABDOMINAL HYSTERECTOMY    . CHOLECYSTECTOMY    . KNEE SURGERY    . TOTAL HIP ARTHROPLASTY      There were no vitals filed for this visit.  Subjective Assessment - 10/24/17 1408    Subjective  Pt relays she felt better after last sesson until last night and then the pain came back to 6/10    Diagnostic tests  C spine X-rays "Multilevel osteoarthritic change. Mild spondylolisthesis at C7-T1, likely due to underlying spondylosis. No other spondylolisthesis. Rt shoulder x ray show no fracture but Mild osteoarthritic changes of the glenohumeral and    Patient Stated Goals  stop the popping in her Rt shoulder    Pain Score  6     Pain Location  Shoulder    Pain Orientation  Right         [facility] PT Assessment - 10/24/17 1410      Assessment   Medical Diagnosis  Chronic neck (spondylosis) and R shoulder pain  (Pended)     Referring Provider  Dr. Karie Schwalbe  (Pended)     Onset Date/Surgical Date  --  (Pended)  chronic neck pain 10 years and Rt shlder pain 1 year    Next MD Visit  6 weeks  (Pended)     Prior Therapy  PT for LE in past,  none for UE  (Pended)       Precautions   Precautions  None  (Pended)       Prior Function   Level of Independence  Independent with basic ADLs  (Pended)     Vocation  Retired  Market researcher)     Leisure  playing cards  (Pended)       Cognition   Overall Cognitive Status  Within Functional Limits for tasks assessed  (Pended)       Observation/Other Assessments   Focus on Therapeutic Outcomes (FOTO)   Next visit  (Pended)       Sensation   Light Touch  Appears Intact  (Pended)       Coordination   Gross Motor Movements are Fluid and Coordinated  Yes  (Pended)       Posture/Postural Control   Posture Comments  fwd head, rounded shouldersincreased T kyphosis      AROM   Right Shoulder Flexion  120 Degrees  (Pended)     Right Shoulder ABduction  95 Degrees  (Pended)     Right Shoulder Internal Rotation  --  (  Pended)  L4    Right Shoulder External Rotation  --  (Pended)  C7    Cervical Flexion  50%  (Pended)     Cervical Extension  25%  (Pended)     Cervical - Right Side Bend  25%  (Pended)     Cervical - Left Side Bend  25%  (Pended)     Cervical - Right Rotation  50%  (Pended)     Cervical - Left Rotation  50%  (Pended)       Strength   Right Shoulder Flexion  4-/5  (Pended)     Right Shoulder ABduction  4-/5  (Pended)     Right Shoulder Internal Rotation  4+/5  (Pended)     Right Shoulder External Rotation  4-/5  (Pended)       Flexibility   Soft Tissue Assessment /Muscle Length  --  (Pended)  Tightness in Upper trap, pecs, bicep, cervical P.S      Palpation   Palpation comment  TTP localized to biceps tendon and anterior shoulder  (Pended)       Special Tests    Special Tests  --  (Pended)  negative spurlings test, positive impingement tests    Other special tests  Speeds and Obrien test + but neg crank/clunk test  Andrez Grime)                    [facility] Adult PT Treatment/Exercise - 10/24/17 1410      Exercises   Exercises  Neck;Shoulder;Elbow      Elbow  Exercises   Elbow Flexion  20 reps;Strengthening 2 lbs eccentric 5 sec lower      Neck Exercises: Seated   Neck Retraction  20 reps      Shoulder Exercises: Supine   Flexion  Strengthening;20 reps      Modalities   Modalities  Iontophoresis;Moist Heat;Electrical Stimulation;Ultrasound      Moist Heat Therapy   Number Minutes Moist Heat  15 Minutes    Moist Heat Location  Shoulder;Cervical      Electrical Stimulation   Electrical Stimulation Location  Rt ant shoulder and bilat neck    Electrical Stimulation Action  IFC    Electrical Stimulation Parameters  tolerance    Electrical Stimulation Goals  Pain      Ultrasound   Ultrasound Location  Rt ant shldr, biceps tendon    Ultrasound Parameters  1.0, 1.0, 100%, 8 min    Ultrasound Goals  Pain      Iontophoresis   Type of Iontophoresis  Dexamethasone    Location  Rt anterior shoulder    Dose  80 mA    Time  take home patch 14 hr      Manual Therapy   Manual Therapy  Soft tissue mobilization    Soft tissue mobilization  STM and CFM to biceps, and STM to pecs, UT, and cervical      Neck Exercises: Stretches   Chest Stretch  2 reps;30 seconds    Chest Stretch Limitations  -- high and low door             PT Education - 10/24/17 1607    Education Details  Pt educated to hold on the exercises at home that are painful    Person(s) Educated  Patient    Methods  Explanation    Comprehension  Verbalized understanding          PT Long Term Goals - 10/22/17 1009  PT LONG TERM GOAL #1   Title  Pt will be I and compliant with HEP. 6 weeks 11/29/17    Status  On-going      PT LONG TERM GOAL #2   Title  Pt will improve cervical and Rt shoulder AROM to [facility] to perform reaching. 6 weeks 11/29/17    Status  On-going      PT LONG TERM GOAL #3   Title  Pt will improve Rt shoulder strength to at least 4+/5 to increase funciton. 6 weeks 11/29/17    Status  On-going      PT LONG TERM GOAL #4   Title  Pt will relay  overall less than 3/10 neck/shoulder pain with all ADL's. 6 weeks 11/29/17    Status  On-going            Plan - 10/24/17 1608    Clinical Impression Statement  Pt had pain with resisted ER with HEP and this was held today. She was treated with therex for neck/shoulder/bicep stretching and strengthening and was able to progress eccentric bicep curl from 1 lb to 2 lb and progress supine shoulder flexion from AAROM to AROM. Pt continues to report benefit from modalities so this was continued today with addditon of STM/CFM to further reduce muscle restrictions and pain. Continue poc.    Rehab Potential  Good    Clinical Impairments Affecting Rehab Potential  MS    PT Frequency  2x / week    PT Duration  6 weeks    PT Treatment/Interventions  ADLs/Self Care Home Management;Cryotherapy;Electrical Stimulation;Iontophoresis 4mg /ml Dexamethasone;Moist Heat;Traction;Ultrasound;Therapeutic exercise;Neuromuscular re-education;Manual techniques;Passive range of motion;Dry needling    PT Next Visit Plan  progress strenthening an streching as tolerated, modalites PRN    Consulted and Agree with Plan of Care  Patient       Patient will benefit from skilled therapeutic intervention in order to improve the following deficits and impairments:  Decreased activity tolerance, Decreased endurance, Decreased strength, Hypomobility, Increased muscle spasms, Impaired flexibility, Postural dysfunction, Pain  Visit Diagnosis: Chronic right shoulder pain  Spondylosis of cervical region without myelopathy or radiculopathy  Multiple sclerosis [facility])     Problem List Patient Active Problem List   Diagnosis Date Noted  . Multiple sclerosis (HCC) 10/17/2017  . Spondylosis of cervical spine 10/17/2017  . Chronic right shoulder pain 10/17/2017    April Manson, PT, DPT 10/24/2017, 4:12 PM  [facility] [address] [place], [place], [address] Phone:  [phone]   Fax:  [phone]  Name: Cilicia Borden MRN: [id number] Date of Birth: 1946-03-05

## 2017-10-28 ENCOUNTER — Encounter: Payer: Self-pay | Admitting: Physical Therapy

## 2017-10-28 ENCOUNTER — Ambulatory Visit (INDEPENDENT_AMBULATORY_CARE_PROVIDER_SITE_OTHER): Payer: Medicare Other | Admitting: Physical Therapy

## 2017-10-28 DIAGNOSIS — G8929 Other chronic pain: Secondary | ICD-10-CM

## 2017-10-28 DIAGNOSIS — G35 Multiple sclerosis: Secondary | ICD-10-CM

## 2017-10-28 DIAGNOSIS — M25511 Pain in right shoulder: Secondary | ICD-10-CM

## 2017-10-28 DIAGNOSIS — M47812 Spondylosis without myelopathy or radiculopathy, cervical region: Secondary | ICD-10-CM

## 2017-10-28 NOTE — Therapy (Signed)
[facility] [place] [address] [place], [place], [address] Phone: [phone]   Fax:  [phone]  Physical Therapy Treatment  Patient Details  Name: Anna Carpenter MRN: [id number] Date of Birth: [birth year] Referring Provider: T. Thekkekandem   Encounter Date: 10/28/2017  PT End of Session - 10/28/17 1229    Visit Number  4    Number of Visits  12    Date for PT Re-Evaluation  11/29/17    PT Start Time  1100    PT Stop Time  1145    PT Time Calculation (min)  45 min    Activity Tolerance  Patient tolerated treatment well       Past Medical History:  Diagnosis Date  . Alopecia   . Hypothyroidism   . IBS (irritable bowel syndrome)   . Lactose intolerance   . Mitral valve regurgitation   . Multiple sclerosis (HCC)   . Raynaud's disease     Past Surgical History:  Procedure Laterality Date  . ABDOMINAL HYSTERECTOMY    . CHOLECYSTECTOMY    . KNEE SURGERY    . TOTAL HIP ARTHROPLASTY      There were no vitals filed for this visit.  Subjective Assessment - 10/28/17 1158    Subjective  Pt relays she may be overdoing it with her exercises causing more pain, but she feels better for a couple days after PT. She relays her neck is feeling some better.    Pertinent History  Hx of MS, THA, allergic to NSAIDS    Diagnostic tests  C spine X-rays "Multilevel osteoarthritic change. Mild spondylolisthesis at C7-T1, likely due to underlying spondylosis. No other spondylolisthesis. Rt shoulder x ray show no fracture but Mild osteoarthritic changes of the glenohumeral and    Patient Stated Goals  stop the popping in her Rt shoulder    Currently in Pain?  Yes    Pain Score  6     Pain Location  Shoulder    Pain Orientation  Right    Pain Descriptors / Indicators  Sharp    Pain Onset  More than a month ago    Pain Frequency  Intermittent                       [facility] Adult PT Treatment/Exercise - 10/28/17 1201       Posture/Postural Control   Posture Comments  fwd head, rounded shouldersincreased T kyphosis      Exercises   Exercises  Neck;Shoulder;Elbow      Elbow Exercises   Elbow Flexion  Strengthening;10 reps 2 lbs eccentric 5 sec lower, 3 ways sup/pro/neutral      Neck Exercises: Seated   Neck Retraction  20 reps      Shoulder Exercises: Supine   Flexion  --      Shoulder Exercises: Pulleys   Flexion  2 minutes    ABduction  2 minutes      Shoulder Exercises: ROM/Strengthening   Pendulum  circles CW/CCW X 20 with 2 lbs    Other ROM/Strengthening Exercises  wall ladder flexion/scaption X 10 ea      Shoulder Exercises: Isometric Strengthening   External Rotation  Other (comment) 5 sec X 15      Modalities   Modalities  Iontophoresis;Moist Heat;Electrical Stimulation;Ultrasound      Moist Heat Therapy   Moist Heat Location  --      Programme researcher, broadcasting/film/video Location  --  Electrical Stimulation Goals  --      Ultrasound   Ultrasound Location  Rt ant shldr/biceps tendon    Ultrasound Parameters  1.0 w/cm, 3.3 mhz, 100% 8 min    Ultrasound Goals  Pain      Iontophoresis   Type of Iontophoresis  Dexamethasone    Location  Rt anterior shoulder    Dose  80 mA    Time  take home patch 14 hr      Manual Therapy   Manual Therapy  --    Soft tissue mobilization  --      Neck Exercises: Stretches   Chest Stretch  2 reps;30 seconds    Chest Stretch Limitations  -- high and low door             PT Education - 10/28/17 1229    Education Details  new exercises, pt given updated illustrated copy    Person(s) Educated  Patient    Methods  Explanation;Demonstration;Verbal cues;Handout    Comprehension  Verbalized understanding;Need further instruction          PT Long Term Goals - 10/22/17 1009      PT LONG TERM GOAL #1   Title  Pt will be I and compliant with HEP. 6 weeks 11/29/17    Status  On-going      PT LONG TERM GOAL #2   Title  Pt  will improve cervical and Rt shoulder AROM to [facility] to perform reaching. 6 weeks 11/29/17    Status  On-going      PT LONG TERM GOAL #3   Title  Pt will improve Rt shoulder strength to at least 4+/5 to increase funciton. 6 weeks 11/29/17    Status  On-going      PT LONG TERM GOAL #4   Title  Pt will relay overall less than 3/10 neck/shoulder pain with all ADL's. 6 weeks 11/29/17    Status  On-going            Plan - 10/28/17 1229    Clinical Impression Statement  Pt shown new exercises of isometrics for RTC strengthening since active strengthening with TB was painful. She was progresssed with shoulder AAROM today adding in wall walks. Bicep strengthening progressed to add in neutral and pronated grip with good tolerance. She was again treated with U.S and IONTO as she continues to relay this is helping. Continue poc    Rehab Potential  Good    Clinical Impairments Affecting Rehab Potential  MS    PT Frequency  2x / week    PT Duration  6 weeks    PT Treatment/Interventions  ADLs/Self Care Home Management;Cryotherapy;Electrical Stimulation;Iontophoresis 4mg /ml Dexamethasone;Moist Heat;Traction;Ultrasound;Therapeutic exercise;Neuromuscular re-education;Manual techniques;Passive range of motion;Dry needling    PT Next Visit Plan  update measurments and goals, progress strenthening an streching as tolerated, modalites PRN    Consulted and Agree with Plan of Care  Patient       Patient will benefit from skilled therapeutic intervention in order to improve the following deficits and impairments:  Decreased activity tolerance, Decreased endurance, Decreased strength, Hypomobility, Increased muscle spasms, Impaired flexibility, Postural dysfunction, Pain  Visit Diagnosis: Chronic right shoulder pain  Spondylosis of cervical region without myelopathy or radiculopathy  Multiple sclerosis [facility])     Problem List Patient Active Problem List   Diagnosis Date Noted  . Multiple sclerosis  (HCC) 10/17/2017  . Spondylosis of cervical spine 10/17/2017  . Chronic right shoulder pain 10/17/2017  April Manson, PT, DPT 10/28/2017, 12:36 PM  [facility] [address] [place], [place], [address] Phone: [phone]   Fax:  [phone]  Name: Melodye Swor MRN: [id number] Date of Birth: 1945/08/16

## 2017-10-31 ENCOUNTER — Ambulatory Visit (INDEPENDENT_AMBULATORY_CARE_PROVIDER_SITE_OTHER): Payer: Medicare Other | Admitting: Physical Therapy

## 2017-10-31 DIAGNOSIS — M25511 Pain in right shoulder: Secondary | ICD-10-CM

## 2017-10-31 DIAGNOSIS — G35 Multiple sclerosis: Secondary | ICD-10-CM

## 2017-10-31 DIAGNOSIS — G8929 Other chronic pain: Secondary | ICD-10-CM | POA: Diagnosis not present

## 2017-10-31 DIAGNOSIS — M47812 Spondylosis without myelopathy or radiculopathy, cervical region: Secondary | ICD-10-CM

## 2017-10-31 NOTE — Therapy (Signed)
[place], [place], [address] Phone: [phone]   Fax:  [phone]  Physical Therapy Treatment  Patient Details  Name: Anna Carpenter MRN: [id number] Date of Birth: [birth year] Referring Provider: T. Thekkekandem   Encounter Date: 10/31/2017  PT End of Session - 10/31/17 1043    Visit Number  5    Number of Visits  12    Date for PT Re-Evaluation  11/29/17    PT Start Time  0935    PT Stop Time  1030    PT Time Calculation (min)  55 min    Activity Tolerance  Patient tolerated treatment well       Past Medical History:  Diagnosis Date  . Alopecia   . Hypothyroidism   . IBS (irritable bowel syndrome)   . Lactose intolerance   . Mitral valve regurgitation   . Multiple sclerosis ([place])   . Raynaud's disease     Past Surgical History:  Procedure Laterality Date  . ABDOMINAL HYSTERECTOMY    . CHOLECYSTECTOMY    . KNEE SURGERY    . TOTAL HIP ARTHROPLASTY      There were no vitals filed for this visit.  Subjective Assessment - 10/31/17 0959    Subjective  Pt relays the popping in her shoulder is getting a little less but she had one big pop when she pulled something out of drawer.    Pertinent History  Hx of MS, THA, allergic to NSAIDS    Diagnostic tests  C spine X-rays "Multilevel osteoarthritic change. Mild spondylolisthesis at C7-T1, likely due to underlying spondylosis. No other spondylolisthesis. Rt shoulder x ray show no fracture but Mild osteoarthritic changes of the glenohumeral and    Patient Stated Goals  stop the popping in her Rt shoulder    Currently in Pain?  Yes    Pain Score  5  she rates 5.5    Pain Location  Shoulder    Pain Orientation  Right         [facility] PT Assessment - 10/31/17 1004      Assessment   Medical Diagnosis  Chronic neck (spondylosis) and R shoulder pain    Onset Date/Surgical Date  -- chronic neck pain 10 years and Rt shlder pain 1 year    Next  MD Visit  6 weeks    Prior Therapy  PT for LE in past, none for UE      Precautions   Precautions  None      Posture/Postural Control   Posture Comments  fwd head, rounded shouldersincreased T kyphosis      AROM   Right Shoulder Flexion  130 Degrees 130 before pain, 150 total    Right Shoulder ABduction  95 Degrees    Right Shoulder Internal Rotation  -- L4    Right Shoulder External Rotation  -- C7    Cervical Flexion  70%    Cervical Extension  50%    Cervical - Right Side Bend  50%    Cervical - Left Side Bend  50%    Cervical - Right Rotation  70%    Cervical - Left Rotation  70%      Strength   Right Shoulder Flexion  4/5    Right Shoulder ABduction  4/5    Right Shoulder Internal Rotation  5/5    Right Shoulder External Rotation  4/5      Flexibility   Soft Tissue Assessment /Muscle Length  --  Tightness in Upper trap, pecs, bicep, cervical P.S      Palpation   Palpation comment  TTP localized to biceps tendon and anterior shoulder      Special Tests    Special Tests  -- negative spurlings test, positive impingement tests                   [facility] Adult PT Treatment/Exercise - 10/31/17 1004      Elbow Exercises   Elbow Flexion  Strengthening;20 reps 2 lbs eccentric 5 sec lower, 3 ways sup/pro/neutral      Neck Exercises: Machines for Strengthening   UBE (Upper Arm Bike)  2/2 min fwd/bkw L1      Neck Exercises: Seated   Neck Retraction  20 reps      Shoulder Exercises: ROM/Strengthening   Pendulum  circles CW/CCW, ant/post, med/lat X 20 with 2 lbs      Shoulder Exercises: Isometric Strengthening   External Rotation  Other (comment) 5 sec X15    Internal Rotation  Other (comment) 5 secX 15      Modalities   Modalities  Iontophoresis;Ultrasound      Ultrasound   Ultrasound Location  Rt biceps tendon    Ultrasound Parameters  1.2 w/cm, 1.0 mhz, 100% 8 min    Ultrasound Goals  Pain      Iontophoresis   Type of Iontophoresis  Dexamethasone     Location  Rt anterior shoulder    Dose  80 mA    Time  take home patch 14 hr      Neck Exercises: Stretches   Chest Stretch  2 reps;30 seconds                  PT Long Term Goals - 10/31/17 1046      PT LONG TERM GOAL #1   Title  Pt will be I and compliant with HEP. 6 weeks 11/29/17    Status  On-going      PT LONG TERM GOAL #2   Title  Pt will improve cervical and Rt shoulder AROM to [facility] to perform reaching. 6 weeks 11/29/17    Status  On-going      PT LONG TERM GOAL #3   Title  Pt will improve Rt shoulder strength to at least 4+/5 to increase funciton. 6 weeks 11/29/17    Status  Partially Met      PT LONG TERM GOAL #4   Title  Pt will relay overall less than 3/10 neck/shoulder pain with all ADL's. 6 weeks 11/29/17    Status  On-going            Plan - 10/31/17 1044    Clinical Impression Statement  Pt had improved Rt shoulder AROM and strength and improved cervical ROM, see updated meaurements. She was able to progress bicep strengthening today with good tolerance. Overall she is making steady progres but still as moderate pain levels and popping in her shoulder however is is improving. Continue poc.    Clinical Presentation  Stable    Rehab Potential  Good    Clinical Impairments Affecting Rehab Potential  MS    PT Frequency  2x / week    PT Duration  6 weeks    PT Treatment/Interventions  ADLs/Self Care Home Management;Cryotherapy;Electrical Stimulation;Iontophoresis 57m/ml Dexamethasone;Moist Heat;Traction;Ultrasound;Therapeutic exercise;Neuromuscular re-education;Manual techniques;Passive range of motion;Dry needling    PT Next Visit Plan  progress strenthening an streching as tolerated, modalites PRN    Consulted and Agree  with Plan of Care  Patient       Patient will benefit from skilled therapeutic intervention in order to improve the following deficits and impairments:  Decreased activity tolerance, Decreased endurance, Decreased strength,  Hypomobility, Increased muscle spasms, Impaired flexibility, Postural dysfunction, Pain  Visit Diagnosis: Chronic right shoulder pain  Spondylosis of cervical region without myelopathy or radiculopathy  Multiple sclerosis [facility])     Problem List Patient Active Problem List   Diagnosis Date Noted  . Multiple sclerosis ([place]) 10/17/2017  . Spondylosis of cervical spine 10/17/2017  . Chronic right shoulder pain 10/17/2017    Debbe Odea, PT, DPT 10/31/2017, 10:48 AM  [facility] [place], [place], [address] Phone: [phone]   Fax:  [phone]  Name: Anna Carpenter MRN: [id number] Date of Birth: Aug 18, 1945

## 2017-11-04 ENCOUNTER — Ambulatory Visit (INDEPENDENT_AMBULATORY_CARE_PROVIDER_SITE_OTHER): Payer: Medicare Other | Admitting: Physical Therapy

## 2017-11-04 DIAGNOSIS — M25511 Pain in right shoulder: Secondary | ICD-10-CM | POA: Diagnosis not present

## 2017-11-04 DIAGNOSIS — G35 Multiple sclerosis: Secondary | ICD-10-CM | POA: Diagnosis not present

## 2017-11-04 DIAGNOSIS — M47812 Spondylosis without myelopathy or radiculopathy, cervical region: Secondary | ICD-10-CM

## 2017-11-04 DIAGNOSIS — G8929 Other chronic pain: Secondary | ICD-10-CM | POA: Diagnosis not present

## 2017-11-04 NOTE — Therapy (Signed)
[place], [place], [address] Phone: [phone]   Fax:  [phone]  Physical Therapy Treatment  Patient Details  Name: Anna Carpenter MRN: [id number] Date of Birth: Jan 20, 1946 Referring Provider: T. Thekkekandem   Encounter Date: 11/04/2017  PT End of Session - 11/04/17 1222    Visit Number  6    Number of Visits  12    Date for PT Re-Evaluation  11/29/17    PT Start Time  1100    PT Stop Time  1150    PT Time Calculation (min)  50 min    Activity Tolerance  Patient tolerated treatment well    Behavior During Therapy  WFL for tasks assessed/performed       Past Medical History:  Diagnosis Date  . Alopecia   . Hypothyroidism   . IBS (irritable bowel syndrome)   . Lactose intolerance   . Mitral valve regurgitation   . Multiple sclerosis ([place])   . Raynaud's disease     Past Surgical History:  Procedure Laterality Date  . ABDOMINAL HYSTERECTOMY    . CHOLECYSTECTOMY    . KNEE SURGERY    . TOTAL HIP ARTHROPLASTY      There were no vitals filed for this visit.  Subjective Assessment - 11/04/17 1109    Subjective  Pt relays she felt really good after last session and then her shoulder popped loud and painful later that day when she was reaching out while playing cards. She also relays her shouldre popped again she was turning off light.    Patient Stated Goals  stop the popping in her Rt shoulder    Currently in Pain?  Yes    Pain Score  6     Pain Location  Shoulder    Pain Orientation  Right    Pain Descriptors / Indicators  Sharp;Dull    Pain Type  Chronic pain    Pain Onset  More than a month ago    Pain Frequency  Intermittent    Aggravating Factors   reaching    Pain Relieving Factors  rest and PT         [facility] PT Assessment - 11/04/17 1111      Assessment   Medical Diagnosis  Chronic neck (spondylosis) and R shoulder pain    Onset Date/Surgical Date  -- chronic neck  pain 10 years and Rt shlder pain 1 year    Next MD Visit  6 weeks    Prior Therapy  PT for LE in past, none for UE      Precautions   Precautions  None      Posture/Postural Control   Posture Comments  fwd head, rounded shouldersincreased T kyphosis      AROM   Right Shoulder Flexion  130 Degrees 130 before pain, 150 total    Right Shoulder ABduction  95 Degrees    Right Shoulder Internal Rotation  -- L4    Right Shoulder External Rotation  -- C7    Cervical Flexion  70%    Cervical Extension  50%    Cervical - Right Side Bend  50%    Cervical - Left Side Bend  50%    Cervical - Right Rotation  70%    Cervical - Left Rotation  70%      Strength   Right Shoulder Flexion  4/5    Right Shoulder ABduction  4/5    Right Shoulder Internal Rotation  5/5    Right Shoulder External Rotation  4/5      Flexibility   Soft Tissue Assessment /Muscle Length  -- Tightness in Upper trap, pecs, bicep, cervical P.S      Palpation   Palpation comment  TTP localized to biceps tendon and anterior shoulder      Special Tests    Special Tests  -- negative spurlings test, positive impingement tests                   [facility] Adult PT Treatment/Exercise - 11/04/17 1111      Elbow Exercises   Elbow Flexion  Strengthening;20 reps 2 lbs each grip    Other elbow exercises  supination/pronation 2lbs x 20      Neck Exercises: Machines for Strengthening   UBE (Upper Arm Bike)  2/2 min fwd/bkw L1      Neck Exercises: Seated   Neck Retraction  20 reps      Shoulder Exercises: Standing   Internal Rotation  Strengthening;Right;20 reps yellow      Shoulder Exercises: ROM/Strengthening   Pendulum  circles CW/CCW, ant/post, med/lat X 20 with 2 lbs 2 lbs      Shoulder Exercises: Isometric Strengthening   External Rotation  -- 5 sec X 15      Modalities   Modalities  Iontophoresis;Ultrasound      Ultrasound   Ultrasound Location  Rt shldr/biceps    Ultrasound Parameters  1.1 W/cm, 1.0  mhz, 100%    Ultrasound Goals  Pain      Iontophoresis   Type of Iontophoresis  Dexamethasone    Location  Rt anterior shoulder    Dose  -- 120 mA extra strength    Time  take home patch 12 hr      Neck Exercises: Stretches   Chest Stretch  2 reps;30 seconds low/mid/high                  PT Long Term Goals - 10/31/17 1046      PT LONG TERM GOAL #1   Title  Pt will be I and compliant with HEP. 6 weeks 11/29/17    Status  On-going      PT LONG TERM GOAL #2   Title  Pt will improve cervical and Rt shoulder AROM to [facility] to perform reaching. 6 weeks 11/29/17    Status  On-going      PT LONG TERM GOAL #3   Title  Pt will improve Rt shoulder strength to at least 4+/5 to increase funciton. 6 weeks 11/29/17    Status  Partially Met      PT LONG TERM GOAL #4   Title  Pt will relay overall less than 3/10 neck/shoulder pain with all ADL's. 6 weeks 11/29/17    Status  On-going            Plan - 11/04/17 1222    Clinical Impression Statement  Session focused on Rt shouder strength and ROM and she was again treated with modalities for pain and inflammaiton. She does continue to have popping but relays it is less often however it is still a sharp pain when it does happen.     Rehab Potential  Good    Clinical Impairments Affecting Rehab Potential  MS    PT Frequency  2x / week    PT Duration  6 weeks    PT Treatment/Interventions  ADLs/Self Care Home Management;Cryotherapy;Electrical Stimulation;Iontophoresis 74m/ml Dexamethasone;Moist Heat;Traction;Ultrasound;Therapeutic exercise;Neuromuscular re-education;Manual techniques;Passive  range of motion;Dry needling    PT Next Visit Plan  add in chest stregthening, progress shoulder strenthening an streching as tolerated, modalites PRN    Consulted and Agree with Plan of Care  Patient       Patient will benefit from skilled therapeutic intervention in order to improve the following deficits and impairments:  Decreased activity  tolerance, Decreased endurance, Decreased strength, Hypomobility, Increased muscle spasms, Impaired flexibility, Postural dysfunction, Pain  Visit Diagnosis: Chronic right shoulder pain  Spondylosis of cervical region without myelopathy or radiculopathy  Multiple sclerosis [facility])     Problem List Patient Active Problem List   Diagnosis Date Noted  . Multiple sclerosis ([place]) 10/17/2017  . Spondylosis of cervical spine 10/17/2017  . Chronic right shoulder pain 10/17/2017    Debbe Odea, PT, DPT 11/04/2017, 12:25 PM  [facility] [place], [place], [address] Phone: [phone]   Fax:  [phone]  Name: Anna Carpenter MRN: [id number] Date of Birth: May 29, 1945

## 2017-11-07 ENCOUNTER — Ambulatory Visit (INDEPENDENT_AMBULATORY_CARE_PROVIDER_SITE_OTHER): Payer: Medicare Other | Admitting: Physical Therapy

## 2017-11-07 DIAGNOSIS — G8929 Other chronic pain: Secondary | ICD-10-CM

## 2017-11-07 DIAGNOSIS — G35 Multiple sclerosis: Secondary | ICD-10-CM

## 2017-11-07 DIAGNOSIS — M25511 Pain in right shoulder: Secondary | ICD-10-CM | POA: Diagnosis not present

## 2017-11-07 DIAGNOSIS — M47812 Spondylosis without myelopathy or radiculopathy, cervical region: Secondary | ICD-10-CM

## 2017-11-07 NOTE — Therapy (Signed)
[place], [place], [address] Phone: [phone]   Fax:  [phone]  Physical Therapy Treatment  Patient Details  Name: Anna Carpenter MRN: [id number] Date of Birth: [birth year] Referring Provider: T. Thekkekandem   Encounter Date: 11/07/2017  PT End of Session - 11/07/17 1133    Visit Number  7    Number of Visits  12    Date for PT Re-Evaluation  11/29/17    PT Start Time  1120    PT Stop Time  1220    PT Time Calculation (min)  60 min    Activity Tolerance  Patient tolerated treatment well    Behavior During Therapy  WFL for tasks assessed/performed       Past Medical History:  Diagnosis Date  . Alopecia   . Hypothyroidism   . IBS (irritable bowel syndrome)   . Lactose intolerance   . Mitral valve regurgitation   . Multiple sclerosis ([place])   . Raynaud's disease     Past Surgical History:  Procedure Laterality Date  . ABDOMINAL HYSTERECTOMY    . CHOLECYSTECTOMY    . KNEE SURGERY    . TOTAL HIP ARTHROPLASTY      There were no vitals filed for this visit.                    [place] Adult PT Treatment/Exercise - 11/07/17 0001      Self-Care   Self-Care  -- condensed and updated HEP for pt      Elbow Exercises   Elbow Flexion  Strengthening;Right;15 reps;Bar weights/barbell 3 lb X 15 ea in sup,pro,neutral    Other elbow exercises  supination/pronation 3 lbs X 15      Neck Exercises: Machines for Strengthening   UBE (Upper Arm Bike)  2/2 min fwd/bkw L1      Neck Exercises: Seated   Neck Retraction  20 reps      Shoulder Exercises: Supine   Flexion  Strengthening;15 reps 1 lb    Other Supine Exercises  chest press 3 lb X15    Other Supine Exercises  open book chest stretch 5 sec X15      Shoulder Exercises: ROM/Strengthening   Pendulum  circles CW/CCW, ant/post, med/lat X 20 with 3 lbs      Shoulder Exercises: Stretch   Cross Chest Stretch  30 seconds;2  reps ea for low mid high in doorway      Modalities   Modalities  Iontophoresis;Ultrasound      Ultrasound   Ultrasound Location  Rt biceps tendon    Ultrasound Parameters  10%, 0.8 w/cm, 3.40mz static for 5 min    Ultrasound Goals  Pain      Iontophoresis   Type of Iontophoresis  Dexamethasone    Location  Rt anterior shoulder    Dose  120 MA extra strength patch    Time  take home patch 12 hr             PT Education - 11/07/17 1132    Education Details  new exercises and condensed HEP     Person(s) Educated  Patient    Methods  Explanation;Demonstration;Verbal cues    Comprehension  Verbalized understanding;Returned demonstration          PT Long Term Goals - 10/31/17 1046      PT LONG TERM GOAL #1   Title  Pt will be I and compliant with HEP. 6 weeks 11/29/17  Status  On-going      PT LONG TERM GOAL #2   Title  Pt will improve cervical and Rt shoulder AROM to [facility] to perform reaching. 6 weeks 11/29/17    Status  On-going      PT LONG TERM GOAL #3   Title  Pt will improve Rt shoulder strength to at least 4+/5 to increase funciton. 6 weeks 11/29/17    Status  Partially Met      PT LONG TERM GOAL #4   Title  Pt will relay overall less than 3/10 neck/shoulder pain with all ADL's. 6 weeks 11/29/17    Status  On-going            Plan - 11/07/17 1133    Clinical Impression Statement  Session again focused on bicep/shoulder strengthening and stretching with addition of chest strengthening and stretching. She was given new HEP that was condensed and combined and she had good understanding of these and shows good effort with performing HEP. Modlties continued to decrease pain and inflammation. Pt will continue to progress as able    Rehab Potential  Good    Clinical Impairments Affecting Rehab Potential  MS    PT Frequency  2x / week    PT Duration  6 weeks    PT Treatment/Interventions  ADLs/Self Care Home Management;Cryotherapy;Electrical  Stimulation;Iontophoresis 71m/ml Dexamethasone;Moist Heat;Traction;Ultrasound;Therapeutic exercise;Neuromuscular re-education;Manual techniques;Passive range of motion;Dry needling    PT Next Visit Plan  progress stengthening as able, modalites PRN    Consulted and Agree with Plan of Care  Patient       Patient will benefit from skilled therapeutic intervention in order to improve the following deficits and impairments:  Decreased activity tolerance, Decreased endurance, Decreased strength, Hypomobility, Increased muscle spasms, Impaired flexibility, Postural dysfunction, Pain  Visit Diagnosis: Chronic right shoulder pain  Spondylosis of cervical region without myelopathy or radiculopathy  Multiple sclerosis ([facility]     Problem List Patient Active Problem List   Diagnosis Date Noted  . Multiple sclerosis ([place] 10/17/2017  . Spondylosis of cervical spine 10/17/2017  . Chronic right shoulder pain 10/17/2017    BDebbe Odea PT, DPT 11/07/2017, 11:44 AM  [facility] [address]Phone: [phone]  Fax:  [phone] Name: Anna FerrebeeMRN: [id number] of Birth: 9May 26, 1947

## 2017-11-13 ENCOUNTER — Ambulatory Visit (INDEPENDENT_AMBULATORY_CARE_PROVIDER_SITE_OTHER): Payer: Medicare Other | Admitting: Physical Therapy

## 2017-11-13 DIAGNOSIS — M25511 Pain in right shoulder: Secondary | ICD-10-CM | POA: Diagnosis not present

## 2017-11-13 DIAGNOSIS — M47812 Spondylosis without myelopathy or radiculopathy, cervical region: Secondary | ICD-10-CM | POA: Diagnosis not present

## 2017-11-13 DIAGNOSIS — G35 Multiple sclerosis: Secondary | ICD-10-CM | POA: Diagnosis not present

## 2017-11-13 DIAGNOSIS — G8929 Other chronic pain: Secondary | ICD-10-CM | POA: Diagnosis not present

## 2017-11-13 NOTE — Therapy (Signed)
[place], [place], [address] Phone: [phone]   Fax:  [phone]  Physical Therapy Treatment  Patient Details  Name: Anna Carpenter MRN: [id number] Date of Birth: 1946/02/07 Referring Provider: T. Thekkekandem   Encounter Date: 11/13/2017  PT End of Session - 11/13/17 1530    Visit Number  8    Number of Visits  12    Date for PT Re-Evaluation  11/29/17    PT Start Time  1400    PT Stop Time  1443    PT Time Calculation (min)  43 min    Activity Tolerance  Patient tolerated treatment well    Behavior During Therapy  WFL for tasks assessed/performed       Past Medical History:  Diagnosis Date  . Alopecia   . Hypothyroidism   . IBS (irritable bowel syndrome)   . Lactose intolerance   . Mitral valve regurgitation   . Multiple sclerosis ([place])   . Raynaud's disease     Past Surgical History:  Procedure Laterality Date  . ABDOMINAL HYSTERECTOMY    . CHOLECYSTECTOMY    . KNEE SURGERY    . TOTAL HIP ARTHROPLASTY      There were no vitals filed for this visit.  Subjective Assessment - 11/13/17 1414    Subjective  Pt relays her shoulder was doing really good but then she had to help someone load/unload rollator out the car about 8 times total and this has aggravated her shoulder    Currently in Pain?  Yes    Pain Score  5     Pain Location  Shoulder    Pain Orientation  Right         [facility] PT Assessment - 11/13/17 0001      Assessment   Next MD Visit  11/28/17      AROM   Right Shoulder Flexion  145 Degrees    Right Shoulder ABduction  125 Degrees    Cervical Flexion  WFL    Cervical Extension  WFL    Cervical - Right Side Bend  50%    Cervical - Left Side Bend  75%    Cervical - Right Rotation  75%    Cervical - Left Rotation  75%      Strength   Right Shoulder Flexion  5/5    Right Shoulder ABduction  4+/5    Right Shoulder Internal Rotation  5/5    Right Shoulder  External Rotation  4+/5                   [facility] Adult PT Treatment/Exercise - 11/13/17 1500      Self-Care   Self-Care  --      Elbow Exercises   Elbow Flexion  Strengthening;Right;Bar weights/barbell;20 reps 3 lb X 15 ea in sup,pro,neutral    Other elbow exercises  supination/pronation 3 lbs X 20      Neck Exercises: Machines for Strengthening   UBE (Upper Arm Bike)  2/2 min fwd/bkw L1      Neck Exercises: Seated   Neck Retraction  20 reps      Shoulder Exercises: Supine   Flexion  -- 1 lb    Other Supine Exercises  --    Other Supine Exercises  --      Shoulder Exercises: ROM/Strengthening   Pendulum  circles CW/CCW, ant/post, med/lat X 20 with 3 lbs      Shoulder Exercises: Stretch  Cross Chest Stretch  --    Other Shoulder Stretches  doorway stretch 30 sec X2 low and mid      Modalities   Modalities  Iontophoresis;Ultrasound      Ultrasound   Ultrasound Location  Rt biceps tendon    Ultrasound Parameters  10%, 0.8 w/cm, 3.3 mhz 5 min    Ultrasound Goals  Pain      Iontophoresis   Type of Iontophoresis  Dexamethasone    Location  Rt anterior shoulder    Dose  80 mA stat patch    Time  take home patch 6-12 hr                  PT Long Term Goals - 11/13/17 1533      PT LONG TERM GOAL #1   Title  Pt will be I and compliant with HEP. 6 weeks 11/29/17    Status  Achieved      PT LONG TERM GOAL #2   Title  Pt will improve cervical and Rt shoulder AROM to [facility] to perform reaching. 6 weeks 11/29/17    Status  Partially Met      PT LONG TERM GOAL #3   Title  Pt will improve Rt shoulder strength to at least 4+/5 to increase funciton. 6 weeks 11/29/17    Status  Achieved      PT LONG TERM GOAL #4   Title  Pt will relay overall less than 3/10 neck/shoulder pain with all ADL's. 6 weeks 11/29/17    Status  Partially Met            Plan - 11/13/17 1531    Clinical Impression Statement  Pt was somewhat flared up today in Rt shoulder thus  some therex was held. She was able to complete stretching program and biceps strengthening with good overall toleance. She has made good progress with strength and ROM, see updated measurements. She has met 2/4 Pt goals now. Pain levels are up and down but overall trending to less pain. Modalities continued as she relays this is helping her pain.    PT Frequency  2x / week    PT Duration  6 weeks    PT Next Visit Plan  progress stengthening as able, modalites PRN    Consulted and Agree with Plan of Care  Patient       Patient will benefit from skilled therapeutic intervention in order to improve the following deficits and impairments:  Decreased activity tolerance, Decreased endurance, Decreased strength, Hypomobility, Increased muscle spasms, Impaired flexibility, Postural dysfunction, Pain  Visit Diagnosis: Chronic right shoulder pain  Spondylosis of cervical region without myelopathy or radiculopathy  Multiple sclerosis [facility])     Problem List Patient Active Problem List   Diagnosis Date Noted  . Multiple sclerosis ([place]) 10/17/2017  . Spondylosis of cervical spine 10/17/2017  . Chronic right shoulder pain 10/17/2017    Debbe Odea, PT, DPT 11/13/2017, 3:37 PM  [facility] [place], [place], [address] Phone: [phone]   Fax:  [phone]  Name: Anna Carpenter MRN: [id number] Date of Birth: 1945-11-07

## 2017-11-15 ENCOUNTER — Ambulatory Visit (INDEPENDENT_AMBULATORY_CARE_PROVIDER_SITE_OTHER): Payer: Medicare Other | Admitting: Physical Therapy

## 2017-11-15 DIAGNOSIS — G8929 Other chronic pain: Secondary | ICD-10-CM | POA: Diagnosis not present

## 2017-11-15 DIAGNOSIS — M25511 Pain in right shoulder: Secondary | ICD-10-CM | POA: Diagnosis not present

## 2017-11-15 DIAGNOSIS — M47812 Spondylosis without myelopathy or radiculopathy, cervical region: Secondary | ICD-10-CM | POA: Diagnosis not present

## 2017-11-15 DIAGNOSIS — G35 Multiple sclerosis: Secondary | ICD-10-CM | POA: Diagnosis not present

## 2017-11-15 NOTE — Therapy (Signed)
[place], [place], [address] Phone: [phone]   Fax:  [phone]  Physical Therapy Treatment  Patient Details  Name: Anna Carpenter MRN: [id number] Date of Birth: 10/15/1945 Referring Provider: T. Thekkekandem   Encounter Date: 11/15/2017  PT End of Session - 11/15/17 1142    Visit Number  9    Date for PT Re-Evaluation  11/29/17    PT Start Time  1962    PT Stop Time  1110    PT Time Calculation (min)  55 min    Activity Tolerance  Patient tolerated treatment well    Behavior During Therapy  WFL for tasks assessed/performed       Past Medical History:  Diagnosis Date  . Alopecia   . Hypothyroidism   . IBS (irritable bowel syndrome)   . Lactose intolerance   . Mitral valve regurgitation   . Multiple sclerosis ([place])   . Raynaud's disease     Past Surgical History:  Procedure Laterality Date  . ABDOMINAL HYSTERECTOMY    . CHOLECYSTECTOMY    . KNEE SURGERY    . TOTAL HIP ARTHROPLASTY      There were no vitals filed for this visit.  Subjective Assessment - 11/15/17 1113    Subjective  Pt relays her entire shoulder is hurting more.     Pertinent History  Hx of MS, THA, allergic to NSAIDS    Diagnostic tests  C spine X-rays "Multilevel osteoarthritic change. Mild spondylolisthesis at C7-T1, likely due to underlying spondylosis. No other spondylolisthesis. Rt shoulder x ray show no fracture but Mild osteoarthritic changes of the glenohumeral and    Patient Stated Goals  stop the popping in her Rt shoulder    Currently in Pain?  Yes    Pain Score  6     Pain Location  Shoulder    Pain Orientation  Right    Pain Descriptors / Indicators  Sharp;Tightness                       [facility] Adult PT Treatment/Exercise - 11/15/17 0001      Elbow Exercises   Elbow Flexion  Strengthening;Right;Bar weights/barbell;20 reps      Neck Exercises: Machines for Strengthening   UBE  (Upper Arm Bike)  2/2 min fwd/bkw L1      Shoulder Exercises: ROM/Strengthening   Pendulum  circles CW/CCW, ant/post, med/lat X 20 with 3 lbs      Shoulder Exercises: Stretch   Other Shoulder Stretches  doorway stretch 30 sec X2 low and mid      Modalities   Modalities  Iontophoresis;Electrical Stimulation;Cryotherapy      Cryotherapy   Number Minutes Cryotherapy  15 Minutes   with TENS   Cryotherapy Location  Shoulder    Type of Cryotherapy  Ice pack      Electrical Stimulation   Electrical Stimulation Location  Rt shoulder    Electrical Stimulation Action  TENS    Electrical Stimulation Parameters  tolerance    Electrical Stimulation Goals  Pain      Iontophoresis   Type of Iontophoresis  Dexamethasone    Location  Rt anterior shoulder    Dose  120 mA extra strength patch    Time  take home patch 6-12 hr      Manual Therapy   Manual Therapy  Passive ROM;Soft tissue mobilization    Soft tissue mobilization  STM/CFM to Rt shoulder ant,lat, post,  superior    Passive ROM  PROM, gentle GH mobs                   PT Long Term Goals - 11/13/17 1533      PT LONG TERM GOAL #1   Title  Pt will be I and compliant with HEP. 6 weeks 11/29/17    Status  Achieved      PT LONG TERM GOAL #2   Title  Pt will improve cervical and Rt shoulder AROM to [facility] to perform reaching. 6 weeks 11/29/17    Status  Partially Met      PT LONG TERM GOAL #3   Title  Pt will improve Rt shoulder strength to at least 4+/5 to increase funciton. 6 weeks 11/29/17    Status  Achieved      PT LONG TERM GOAL #4   Title  Pt will relay overall less than 3/10 neck/shoulder pain with all ADL's. 6 weeks 11/29/17    Status  Partially Met            Plan - 11/15/17 1143    Clinical Impression Statement  Her shoulder was again flared up and some therex was held. More time spent on MT with STM, PROM, and gentle [place] mobs followed by ice and E-stim followed by IONTO to decrease pain and inflammaiton. Pt  was making good progress initially but she has regressed with pain this week.    Rehab Potential  Good    Clinical Impairments Affecting Rehab Potential  MS    PT Frequency  2x / week    PT Duration  6 weeks    PT Treatment/Interventions  ADLs/Self Care Home Management;Cryotherapy;Electrical Stimulation;Iontophoresis 27m/ml Dexamethasone;Moist Heat;Traction;Ultrasound;Therapeutic exercise;Neuromuscular re-education;Manual techniques;Passive range of motion;Dry needling    PT Next Visit Plan  add back in strengthening when pain calms down    Consulted and Agree with Plan of Care  Patient       Patient will benefit from skilled therapeutic intervention in order to improve the following deficits and impairments:  Decreased activity tolerance, Decreased endurance, Decreased strength, Hypomobility, Increased muscle spasms, Impaired flexibility, Postural dysfunction, Pain  Visit Diagnosis: Chronic right shoulder pain  Spondylosis of cervical region without myelopathy or radiculopathy  Multiple sclerosis ([facility]     Problem List Patient Active Problem List   Diagnosis Date Noted  . [place] ([place] 10/17/2017  . Spondylosis of cervical spine 10/17/2017  . Chronic right shoulder pain 10/17/2017    BDebbe Odea PT, DPT 11/15/2017, 11:50 AM  [facility] [address]Phone: [phone]  Fax:  [phone] Name: MKeelan PomerleauMRN: [id number] of Birth: [birth year]

## 2017-11-18 ENCOUNTER — Ambulatory Visit (INDEPENDENT_AMBULATORY_CARE_PROVIDER_SITE_OTHER): Payer: Medicare Other | Admitting: Physical Therapy

## 2017-11-18 DIAGNOSIS — M25511 Pain in right shoulder: Secondary | ICD-10-CM | POA: Diagnosis not present

## 2017-11-18 DIAGNOSIS — G8929 Other chronic pain: Secondary | ICD-10-CM | POA: Diagnosis not present

## 2017-11-18 DIAGNOSIS — M47812 Spondylosis without myelopathy or radiculopathy, cervical region: Secondary | ICD-10-CM | POA: Diagnosis not present

## 2017-11-18 DIAGNOSIS — G35 Multiple sclerosis: Secondary | ICD-10-CM

## 2017-11-18 NOTE — Therapy (Signed)
[facility] [address] [place], [place], [address] Phone: [phone]   Fax:  [phone]  Physical Therapy Treatment  Patient Details  Name: Anna Carpenter MRN: [id number] Date of Birth: 02/23/1946 Referring Provider: T. Thekkekandem   Encounter Date: 11/18/2017  PT End of Session - 11/18/17 1033    Visit Number  10    Number of Visits  12    Date for PT Re-Evaluation  11/29/17    PT Start Time  0935    PT Stop Time  1025    PT Time Calculation (min)  50 min    Activity Tolerance  Patient tolerated treatment well    Behavior During Therapy  WFL for tasks assessed/performed       Past Medical History:  Diagnosis Date  . Alopecia   . Hypothyroidism   . IBS (irritable bowel syndrome)   . Lactose intolerance   . Mitral valve regurgitation   . Multiple sclerosis ([facility])   . Raynaud's disease     Past Surgical History:  Procedure Laterality Date  . ABDOMINAL HYSTERECTOMY    . CHOLECYSTECTOMY    . KNEE SURGERY    . TOTAL HIP ARTHROPLASTY      There were no vitals filed for this visit.  Subjective Assessment - 11/18/17 0942    Subjective  Pt relays her shoulder feels better than last session but still overall 5/10 pain with popping     Currently in Pain?  Yes    Pain Score  5     Pain Location  Shoulder    Pain Orientation  Right                       [facility] Adult PT Treatment/Exercise - 11/18/17 0001      Elbow Exercises   Elbow Flexion  Strengthening;Right;Bar weights/barbell;20 reps   3 lbs eccentric sup,pro, neutral grips     Shoulder Exercises: Supine   Other Supine Exercises  chest press 3 lb 2X10      Shoulder Exercises: Pulleys   Flexion  2 minutes    Scaption  2 minutes      Shoulder Exercises: ROM/Strengthening   Pendulum  circles CW/CCW, ant/post, med/lat X 20 with 3 lbs      Shoulder Exercises: Isometric Strengthening   External Rotation  --   yellow 2X10   Internal Rotation  --   5 sec X20     Shoulder Exercises: Stretch   Other Shoulder Stretches  doorway stretch 30 sec X2 low and mid      Ultrasound   Ultrasound Location  Rt shlder    Ultrasound Parameters  1.0, 1.0, 100%, 8 min    Ultrasound Goals  Pain      Manual Therapy   Manual Therapy  Passive ROM;Soft tissue mobilization    Passive ROM  PROM, gentle GH mobs                   PT Long Term Goals - 11/13/17 1533      PT LONG TERM GOAL #1   Title  Pt will be I and compliant with HEP. 6 weeks 11/29/17    Status  Achieved      PT LONG TERM GOAL #2   Title  Pt will improve cervical and Rt shoulder AROM to WFL to perform reaching. 6 weeks 11/29/17    Status  Partially Met      PT LONG TERM GOAL #  3   Title  Pt will improve Rt shoulder strength to at least 4+/5 to increase funciton. 6 weeks 11/29/17    Status  Achieved      PT LONG TERM GOAL #4   Title  Pt will relay overall less than 3/10 neck/shoulder pain with all ADL's. 6 weeks 11/29/17    Status  Partially Met            Plan - 11/18/17 1059    Clinical Impression Statement  Pt was able to progress back therex into session after pain and inflammation has calmed down from last session. She was making good progress initially but now she may be reaching platue with pain and popping in her shoulder aggravated with reaching. She was treated with stretching and strengthening along with PROM and U.S all to improve ROM and decrease pain. She will return to MD on 11/28/17.    Rehab Potential  Fair    Clinical Impairments Affecting Rehab Potential  MS    PT Frequency  2x / week    PT Duration  6 weeks    PT Treatment/Interventions  ADLs/Self Care Home Management;Cryotherapy;Electrical Stimulation;Iontophoresis 20m/ml Dexamethasone;Moist Heat;Traction;Ultrasound;Therapeutic exercise;Neuromuscular re-education;Manual techniques;Passive range of motion;Dry needling    PT Next Visit Plan  add back in strengthening when pain  calms down       Patient will benefit from skilled therapeutic intervention in order to improve the following deficits and impairments:  Decreased activity tolerance, Decreased endurance, Decreased strength, Hypomobility, Increased muscle spasms, Impaired flexibility, Postural dysfunction, Pain  Visit Diagnosis: Chronic right shoulder pain  Spondylosis of cervical region without myelopathy or radiculopathy  Multiple sclerosis ([facility]     Problem List Patient Active Problem List   Diagnosis Date Noted  . Multiple sclerosis ([place] 10/17/2017  . Spondylosis of cervical spine 10/17/2017  . Chronic right shoulder pain 10/17/2017    BDebbe Odea PT, DPT 11/18/2017, 11:03 AM  [facility] [address]Phone: [phone]  Fax:  [phone] Name: Anna DrawdyMRN: [id number] of Birth: 9Sep 07, 1947

## 2017-11-21 ENCOUNTER — Ambulatory Visit (INDEPENDENT_AMBULATORY_CARE_PROVIDER_SITE_OTHER): Payer: Medicare Other | Admitting: Physical Therapy

## 2017-11-21 DIAGNOSIS — M47812 Spondylosis without myelopathy or radiculopathy, cervical region: Secondary | ICD-10-CM

## 2017-11-21 DIAGNOSIS — M25511 Pain in right shoulder: Secondary | ICD-10-CM

## 2017-11-21 DIAGNOSIS — G8929 Other chronic pain: Secondary | ICD-10-CM

## 2017-11-21 DIAGNOSIS — G35 Multiple sclerosis: Secondary | ICD-10-CM | POA: Diagnosis not present

## 2017-11-21 NOTE — Therapy (Signed)
[place], [place], [address] Phone: [phone]   Fax:  [phone]  Physical Therapy Treatment  Patient Details  Name: Anna Carpenter MRN: [id number] Date of Birth: [birth year] Referring Provider: T. Thekkekandem   Encounter Date: 11/21/2017  PT End of Session - 11/21/17 1147    Visit Number  11    Number of Visits  12    Date for PT Re-Evaluation  11/29/17    PT Start Time  0762    PT Stop Time  1101    PT Time Calculation (min)  46 min    Activity Tolerance  Patient tolerated treatment well    Behavior During Therapy  WFL for tasks assessed/performed       Past Medical History:  Diagnosis Date  . Alopecia   . Hypothyroidism   . IBS (irritable bowel syndrome)   . Lactose intolerance   . Mitral valve regurgitation   . Multiple sclerosis ([place])   . Raynaud's disease     Past Surgical History:  Procedure Laterality Date  . ABDOMINAL HYSTERECTOMY    . CHOLECYSTECTOMY    . KNEE SURGERY    . TOTAL HIP ARTHROPLASTY      There were no vitals filed for this visit.  Subjective Assessment - 11/21/17 1122    Subjective  Pt relays her shoulder feels better after PT but then the pain comes back the next day or 2.    Currently in Pain?  Yes    Pain Score  5     Pain Location  Shoulder    Pain Orientation  Right    Pain Descriptors / Indicators  Tightness;Sharp    Pain Type  Chronic pain    Pain Onset  More than a month ago    Pain Frequency  Intermittent                       [facility] Adult PT Treatment/Exercise - 11/21/17 1141      Exercises   Exercises  Shoulder      Neck Exercises: Machines for Strengthening   UBE (Upper Arm Bike)  2/2 min fwd/bkw L1      Shoulder Exercises: Supine   Other Supine Exercises  chest press 3 lb 2X10    Other Supine Exercises  open book stretch/pec activation X 15      Shoulder Exercises: Standing   External Rotation   Strengthening;Right;Other (comment);20 reps    Theraband Level (Shoulder External Rotation)  Level 1 (Yellow)    Internal Rotation  Strengthening;Right;20 reps;Theraband   isometric 5 sec X15   Flexion  AROM;10 reps    ABduction  AROM;10 reps    Row  Strengthening;20 reps    Theraband Level (Shoulder Row)  Level 1 (Yellow)      Shoulder Exercises: Pulleys   Flexion  --    Scaption  --      Shoulder Exercises: ROM/Strengthening   Pendulum  circles CW/CCW, ant/post, med/lat X 20 with 3 lbs      Shoulder Exercises: Isometric Strengthening   External Rotation  --    Internal Rotation  --      Shoulder Exercises: Stretch   Elbow Flexion  Strengthening;Right;Bar weights/barbell;20 reps   3 lbs eccentric sup,pro, neutral grips   Other Shoulder Stretches  doorway stretch 30 sec X2 low and mid      Ultrasound   Ultrasound Location  Rt shldr    Ultrasound Parameters  1.0 Mhz, .8 w/cm, 100% 9 min    Ultrasound Goals  Pain      Manual Therapy   Manual Therapy  --    Passive ROM  --                  PT Long Term Goals - 11/13/17 1533      PT LONG TERM GOAL #1   Title  Pt will be I and compliant with HEP. 6 weeks 11/29/17    Status  Achieved      PT LONG TERM GOAL #2   Title  Pt will improve cervical and Rt shoulder AROM to [facility] to perform reaching. 6 weeks 11/29/17    Status  Partially Met      PT LONG TERM GOAL #3   Title  Pt will improve Rt shoulder strength to at least 4+/5 to increase funciton. 6 weeks 11/29/17    Status  Achieved      PT LONG TERM GOAL #4   Title  Pt will relay overall less than 3/10 neck/shoulder pain with all ADL's. 6 weeks 11/29/17    Status  Partially Met            Plan - 11/21/17 1148    Clinical Impression Statement  Pt able to perform stregthening and stretching program in pain free range but she continues to have pain if she reaches too far into flexion and or abduction. She feels U.S helps the most and this was performed today  with extra time. She has more more visit left with POC and then she will see MD. She has hit plateau in progress and will benefit from MD evaluation.    Rehab Potential  Fair    Clinical Impairments Affecting Rehab Potential  MS    PT Frequency  2x / week    PT Duration  6 weeks    PT Next Visit Plan  U.S, MD progress note as POC is up, FOTO    Consulted and Agree with Plan of Care  Patient       Patient will benefit from skilled therapeutic intervention in order to improve the following deficits and impairments:  Decreased activity tolerance, Decreased endurance, Decreased strength, Hypomobility, Increased muscle spasms, Impaired flexibility, Postural dysfunction, Pain  Visit Diagnosis: Chronic right shoulder pain  Spondylosis of cervical region without myelopathy or radiculopathy  Multiple sclerosis ([place])     Problem List Patient Active Problem List   Diagnosis Date Noted  . Multiple sclerosis ([place]) 10/17/2017  . Spondylosis of cervical spine 10/17/2017  . Chronic right shoulder pain 10/17/2017    Debbe Odea, PT, DPT 11/21/2017, 11:52 AM  [facility] [place], [place], [address] Phone: [phone]   Fax:  [phone]  Name: Anna Carpenter MRN: [id number] Date of Birth: 1945/07/01

## 2017-11-25 ENCOUNTER — Ambulatory Visit (INDEPENDENT_AMBULATORY_CARE_PROVIDER_SITE_OTHER): Payer: Medicare Other | Admitting: Physical Therapy

## 2017-11-25 DIAGNOSIS — M47812 Spondylosis without myelopathy or radiculopathy, cervical region: Secondary | ICD-10-CM

## 2017-11-25 DIAGNOSIS — G8929 Other chronic pain: Secondary | ICD-10-CM | POA: Diagnosis not present

## 2017-11-25 DIAGNOSIS — M25511 Pain in right shoulder: Secondary | ICD-10-CM | POA: Diagnosis not present

## 2017-11-25 DIAGNOSIS — G35 Multiple sclerosis: Secondary | ICD-10-CM | POA: Diagnosis not present

## 2017-11-25 NOTE — Therapy (Signed)
[facility] [address], [place], [address] Phone: [phone]   Fax:  [phone]  Physical Therapy Treatment/Progress note  Patient Details  Name: Anna Carpenter MRN: [id number] Date of Birth: May 16, 1945 Referring Provider: Dr. Darene Lamer   Encounter Date: 11/25/2017  PT End of Session - 11/25/17 1114    Visit Number  12    Number of Visits  12    Date for PT Re-Evaluation  11/29/17    PT Start Time  5038    PT Stop Time  1103    PT Time Calculation (min)  48 min    Activity Tolerance  Patient tolerated treatment well    Behavior During Therapy  [facility] for tasks assessed/performed       Past Medical History:  Diagnosis Date  . Alopecia   . Hypothyroidism   . IBS (irritable bowel syndrome)   . Lactose intolerance   . Mitral valve regurgitation   . Multiple sclerosis ([place])   . Raynaud's disease     Past Surgical History:  Procedure Laterality Date  . ABDOMINAL HYSTERECTOMY    . CHOLECYSTECTOMY    . KNEE SURGERY    . TOTAL HIP ARTHROPLASTY      There were no vitals filed for this visit.  Subjective Assessment - 11/25/17 1112    Subjective  Pt relays she actually had a good couple of days with her shoulder but still has some pain and popping intermittently.    Patient Stated Goals  stop the popping in her Rt shoulder    Currently in Pain?  Yes    Pain Score  3     Pain Location  Shoulder    Pain Orientation  Right    Pain Descriptors / Indicators  Sharp;Tightness    Pain Type  Chronic pain    Pain Onset  More than a month ago    Pain Frequency  Intermittent    Aggravating Factors   reaching         [facility] PT Assessment - 11/25/17 0001      Assessment   Medical Diagnosis  Chronic neck (spondylosis) and R shoulder pain    Referring Provider  Dr. Darene Lamer    Next MD Visit  11/28/17      AROM   Right Shoulder Flexion  150 Degrees    Right Shoulder ABduction  125 Degrees    Cervical Flexion  WFL    Cervical Extension  WFL    Cervical - Right Side Bend  50%    Cervical - Left Side Bend  75%    Cervical - Right Rotation  75%    Cervical - Left Rotation  75%      Strength   Right Shoulder Flexion  5/5    Right Shoulder ABduction  4+/5    Right Shoulder Internal Rotation  5/5    Right Shoulder External Rotation  --   5-                  [facility] Adult PT Treatment/Exercise - 11/25/17 1109      Exercises   Exercises  Shoulder      Neck Exercises: Machines for Strengthening   UBE (Upper Arm Bike)  3 min warm up nu step arms only      Shoulder Exercises: Supine   Other Supine Exercises  chest press 3 lb 2X10    Other Supine Exercises  open book stretch/pec activation X 15  Shoulder Exercises: Standing   External Rotation  Strengthening;Right;Other (comment);20 reps    Theraband Level (Shoulder External Rotation)  Level 1 (Yellow)    Internal Rotation  Strengthening;Right;20 reps;Theraband   isometric 5 sec X15   Flexion  --    ABduction  --    Row  Strengthening;20 reps    Theraband Level (Shoulder Row)  Level 1 (Yellow)      Shoulder Exercises: ROM/Strengthening   Pendulum  circles CW/CCW, ant/post, med/lat X 20 with 3 lbs      Shoulder Exercises: Isometric Strengthening   Internal Rotation  5X10"      Shoulder Exercises: Stretch   Elbow Flexion  Strengthening;Right;Bar weights/barbell;20 reps   3 lbs eccentric sup,pro, neutral grips   Other Shoulder Stretches  doorway stretch 30 sec X2 low and mid      Ultrasound   Ultrasound Location  Rt shldr    Ultrasound Parameters  .8 w/cm, 1.0 mhz, 100%, 10 min    Ultrasound Goals  Pain                  PT Long Term Goals - 11/25/17 1153      PT LONG TERM GOAL #1   Title  Pt will be I and compliant with HEP. 6 weeks 11/29/17    Status  Achieved      PT LONG TERM GOAL #2   Title  Pt will improve cervical and Rt shoulder AROM to [facility] to perform reaching. 6 weeks 11/29/17    Status  Partially Met       PT LONG TERM GOAL #3   Title  Pt will improve Rt shoulder strength to at least 4+/5 to increase funciton. 6 weeks 11/29/17    Status  Achieved      PT LONG TERM GOAL #4   Title  Pt will relay overall less than 3/10 neck/shoulder pain with all ADL's. 6 weeks 11/29/17    Status  Partially Met            Plan - 11/25/17 1114    Clinical Impression Statement  Pt has made some improvements in Rt shoulder strength and AROM but she is still limited in these areas along with pain and popping in her shoulder that is intermittent with reaching up or out.  She may be hitting plateau with PT and may benefit from [place] or further imaging as she continues to have pain and popping in her Rt biceps tendon area. She is out of PT plan of care and will need RE next visit if MD feels PT should continue.    Rehab Potential  Fair    Clinical Impairments Affecting Rehab Potential  MS    PT Frequency  2x / week    PT Duration  6 weeks    PT Treatment/Interventions  ADLs/Self Care Home Management;Cryotherapy;Electrical Stimulation;Iontophoresis 54m/ml Dexamethasone;Moist Heat;Traction;Ultrasound;Therapeutic exercise;Neuromuscular re-education;Manual techniques;Passive range of motion;Dry needling    PT Next Visit Plan  Await futher MD recommendations on POC    Consulted and Agree with Plan of Care  Patient       Patient will benefit from skilled therapeutic intervention in order to improve the following deficits and impairments:  Decreased activity tolerance, Decreased endurance, Decreased strength, Hypomobility, Increased muscle spasms, Impaired flexibility, Postural dysfunction, Pain  Visit Diagnosis: Chronic right shoulder pain  Spondylosis of cervical region without myelopathy or radiculopathy  Multiple sclerosis ([facility]     Problem List Patient Active Problem List   Diagnosis Date  Noted  . Multiple sclerosis ([place]) 10/17/2017  . Spondylosis of cervical spine 10/17/2017  . Chronic  right shoulder pain 10/17/2017    Debbe Odea, PT, DPT 11/25/2017, 11:54 AM  [facility] [place], [place], [address] Phone: [phone]   Fax:  [phone]  Name: Anna Carpenter MRN: [id number] Date of Birth: [birth year]

## 2017-11-28 ENCOUNTER — Encounter: Payer: Self-pay | Admitting: Sports Medicine

## 2017-11-28 ENCOUNTER — Ambulatory Visit (INDEPENDENT_AMBULATORY_CARE_PROVIDER_SITE_OTHER): Payer: Medicare Other | Admitting: Sports Medicine

## 2017-11-28 DIAGNOSIS — M47812 Spondylosis without myelopathy or radiculopathy, cervical region: Secondary | ICD-10-CM

## 2017-11-28 DIAGNOSIS — M25511 Pain in right shoulder: Secondary | ICD-10-CM | POA: Diagnosis not present

## 2017-11-28 DIAGNOSIS — G8929 Other chronic pain: Secondary | ICD-10-CM

## 2017-11-28 NOTE — Assessment & Plan Note (Addendum)
Multilevel cervical DDD, axial pain without radiculopathy or myelopathy. She does have MS but with no long tract signs we avoided cervical spine MRI. Improving with therapy.

## 2017-11-28 NOTE — Assessment & Plan Note (Signed)
Pain is bicipital and glenohumeral. Ultrasound today did show significant glenohumeral degenerative changes, bicipital fraying, subscapularis fraying, articular sided supraspinatus fraying. Continue therapy, she has not yet plateaued.   Would be the addition of an NSAID before considering glenohumeral injection. Continues to desire to avoid a pharmacologic approach.

## 2017-11-28 NOTE — Progress Notes (Signed)
Subjective:    CC: Follow-up  HPI: Right shoulder pain: Improved considerably with 6 weeks of physical therapy, occasional pain at the anterior and posterior joint line.  X-ray showed multiple degenerative changes as expected for a 72 year old.  Only a few mechanical symptoms.  Neck pain: Also much better with physical therapy.  I reviewed the past medical history, family history, social history, surgical history, and allergies today and no changes were needed.  Please see the problem list section below in epic for further details.  Past Medical History: Past Medical History:  Diagnosis Date  . Alopecia   . Hypothyroidism   . IBS (irritable bowel syndrome)   . Lactose intolerance   . Mitral valve regurgitation   . Multiple sclerosis ([facility])   . Raynaud's disease    Past Surgical History: Past Surgical History:  Procedure Laterality Date  . ABDOMINAL HYSTERECTOMY    . CHOLECYSTECTOMY    . KNEE SURGERY    . TOTAL HIP ARTHROPLASTY     Social History: Social History   Socioeconomic History  . Marital status: Married    Spouse name: Not on file  . Number of children: Not on file  . Years of education: Not on file  . Highest education level: Not on file  Occupational History  . Not on file  Social Needs  . Financial resource strain: Not on file  . Food insecurity:    Worry: Not on file    Inability: Not on file  . Transportation needs:    Medical: Not on file    Non-medical: Not on file  Tobacco Use  . Smoking status: Former Games developer  . Smokeless tobacco: Never Used  Substance and Sexual Activity  . Alcohol use: No  . Drug use: No  . Sexual activity: Not Currently  Lifestyle  . Physical activity:    Days per week: Not on file    Minutes per session: Not on file  . Stress: Not on file  Relationships  . Social connections:    Talks on phone: Not on file    Gets together: Not on file    Attends religious service: Not on file    Active member of club or  organization: Not on file    Attends meetings of clubs or organizations: Not on file    Relationship status: Not on file  Other Topics Concern  . Not on file  Social History Narrative  . Not on file   Family History: Family History  Problem Relation Age of Onset  . Hypertension Mother   . Cancer Mother        Ovarian   Allergies: Allergies  Allergen Reactions  . Gabapentin   . Macrobid [employer] Macro]   . Nsaids   . Sulfa Antibiotics   . Tetracyclines & Related   . Thimerosal    Medications: See med rec.  Review of Systems: No fevers, chills, night sweats, weight loss, chest pain, or shortness of breath.   Objective:    General: Well Developed, well nourished, and in no acute distress.  Neuro: Alert and oriented x3, extra-ocular muscles intact, sensation grossly intact.  HEENT: Normocephalic, atraumatic, pupils equal round reactive to light, neck supple, no masses, no lymphadenopathy, thyroid nonpalpable.  Skin: Warm and dry, no rashes. Cardiac: Regular rate and rhythm, no murmurs rubs or gallops, no lower extremity edema.  Respiratory: Clear to auscultation bilaterally. Not using accessory muscles, speaking in full sentences. Right shoulder: Inspection reveals no abnormalities, atrophy or  asymmetry. Palpation is normal with no tenderness over AC joint or bicipital groove. ROM is full in all planes. Rotator cuff strength normal throughout. Mild impingement signs. Speeds and Yergason's tests normal. No labral pathology noted with negative Obrien's, negative crank, negative clunk, and good stability. Normal scapular function observed. No painful arc and no drop arm sign. No apprehension sign  Procedure: Diagnostic Ultrasound of right shoulder Device: GE Logiq E  Findings: I imaged to the biceps tendon and the sheath, acromioclavicular joint, supraspinatus, infraspinatus, subscapularis, and teres minor.  All structures were normal with the exception of  the biceps tendon, it appeared diminutive with some fluid in the sheath, there was an articular sided partial-thickness, partial width supraspinatus tear, as well as a partial width, partial-thickness articular sided subscapularis tear. Images permanently stored and available for review in the ultrasound unit.  Impression: Biceps tendinitis, partial width, partial-thickness articular sided tears of the subscapularis and supraspinatus.  Impression and Recommendations:    Chronic right shoulder pain Pain is bicipital and glenohumeral. Ultrasound today did show significant glenohumeral degenerative changes, bicipital fraying, subscapularis fraying, articular sided supraspinatus fraying. Continue therapy, she has not yet plateaued.   Would be the addition of an NSAID before considering glenohumeral injection. Continues to desire to avoid a pharmacologic approach.  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 She does have MS but with no long tract signs we avoided cervical spine MRI. Improving with therapy.  ___________________________________________ Ihor Austin. Benjamin Stain, M.D., ABFM., CAQSM. Primary Care and Sports Medicine [facility]  Adjunct Instructor of Family Medicine  [employer]

## 2017-11-29 DIAGNOSIS — Z23 Encounter for immunization: Secondary | ICD-10-CM | POA: Diagnosis not present

## 2017-11-29 DIAGNOSIS — S60519A Abrasion of unspecified hand, initial encounter: Secondary | ICD-10-CM | POA: Diagnosis not present

## 2017-11-29 DIAGNOSIS — R0982 Postnasal drip: Secondary | ICD-10-CM | POA: Diagnosis not present

## 2017-11-29 DIAGNOSIS — J029 Acute pharyngitis, unspecified: Secondary | ICD-10-CM | POA: Diagnosis not present

## 2017-12-02 ENCOUNTER — Encounter: Payer: Medicare Other | Admitting: Physical Therapy

## 2017-12-03 ENCOUNTER — Ambulatory Visit (INDEPENDENT_AMBULATORY_CARE_PROVIDER_SITE_OTHER): Payer: Medicare Other | Admitting: Physical Therapy

## 2017-12-03 ENCOUNTER — Encounter: Payer: Self-pay | Admitting: Physical Therapy

## 2017-12-03 DIAGNOSIS — M25511 Pain in right shoulder: Secondary | ICD-10-CM

## 2017-12-03 DIAGNOSIS — M47812 Spondylosis without myelopathy or radiculopathy, cervical region: Secondary | ICD-10-CM

## 2017-12-03 DIAGNOSIS — G8929 Other chronic pain: Secondary | ICD-10-CM

## 2017-12-03 NOTE — Therapy (Signed)
[place], [place], [address] Phone: [phone]   Fax:  [phone]  Physical Therapy Treatment  Patient Details  Name: Anna Carpenter MRN: [id number] Date of Birth: 1945-07-20 Referring Provider: Dr. Darene Lamer   Encounter Date: 12/03/2017  PT End of Session - 12/03/17 1146    Visit Number  13    Number of Visits  20    Date for PT Re-Evaluation  12/31/17    Authorization Type  Medicare    PT Start Time  0848    PT Stop Time  0928    PT Time Calculation (min)  40 min    Activity Tolerance  Patient tolerated treatment well    Behavior During Therapy  [facility] for tasks assessed/performed       Past Medical History:  Diagnosis Date  . Alopecia   . Hypothyroidism   . IBS (irritable bowel syndrome)   . Lactose intolerance   . Mitral valve regurgitation   . Multiple sclerosis ([place])   . Raynaud's disease     Past Surgical History:  Procedure Laterality Date  . ABDOMINAL HYSTERECTOMY    . CHOLECYSTECTOMY    . KNEE SURGERY    . TOTAL HIP ARTHROPLASTY      There were no vitals filed for this visit.  Subjective Assessment - 12/03/17 0850    Subjective  "it hurts the same."  still having some popping and pain    Patient Stated Goals  stop the popping in her Rt shoulder    Currently in Pain?  Yes    Pain Score  5     Pain Location  Shoulder    Pain Orientation  Right    Pain Descriptors / Indicators  Sharp;Tightness    Pain Type  Chronic pain    Pain Onset  More than a month ago    Pain Frequency  Intermittent    Aggravating Factors   reaching    Pain Relieving Factors  rest and PT         [facility] PT Assessment - 12/03/17 0901      AROM   Right Shoulder Flexion  115 Degrees   with pain 125                  [facility] Adult PT Treatment/Exercise - 12/03/17 0851      Neck Exercises: Machines for Strengthening   UBE (Upper Arm Bike)  L2 x 4 min; 2/2      Shoulder Exercises:  Stretch   Other Shoulder Stretches  doorway stretch 30 sec X2 low and mid      Ultrasound   Ultrasound Location  Rt shoulder    Ultrasound Parameters  .8 w/cm, 1.0 mhz, 100%, 10 min    Ultrasound Goals  Pain      Manual Therapy   Soft tissue mobilization  Rt post/ant shoulder including LS, UT, biceps and pecs                  PT Long Term Goals - 12/03/17 1155      PT LONG TERM GOAL #1   Title  Pt will be I and compliant with HEP. 6 weeks 11/29/17    Status  Achieved      PT LONG TERM GOAL #2   Title  Pt will improve cervical and Rt shoulder AROM to [facility] to perform reaching.     Status  On-going    Target Date  12/31/17  PT LONG TERM GOAL #3   Title  Pt will improve Rt shoulder strength to at least 4+/5 to increase funciton. 6 weeks 11/29/17    Status  Achieved      PT LONG TERM GOAL #4   Title  Pt will relay overall less than 3/10 neck/shoulder pain with all ADL's.     Status  On-going    Target Date  12/31/17            Plan - 12/03/17 1155    Clinical Impression Statement  Pt has met 2/4 LTGs and with pain and ROM goals ongoing but improving.  Pt continues to have pain in Rt shoulder but reports decreased popping episodes.  Will continue to benefit from PT to maximize function.  Plans to follow up with MD next week to determine if additional interventions are needed.    Rehab Potential  Fair    Clinical Impairments Affecting Rehab Potential  MS    PT Frequency  2x / week    PT Duration  6 weeks    PT Treatment/Interventions  ADLs/Self Care Home Management;Cryotherapy;Electrical Stimulation;Iontophoresis 75m/ml Dexamethasone;Moist Heat;Traction;Ultrasound;Therapeutic exercise;Neuromuscular re-education;Manual techniques;Passive range of motion;Dry needling    PT Next Visit Plan  continue posture, manual, modalities PRN    Consulted and Agree with Plan of Care  Patient       Patient will benefit from skilled therapeutic intervention in order to improve  the following deficits and impairments:  Decreased activity tolerance, Decreased endurance, Decreased strength, Hypomobility, Increased muscle spasms, Impaired flexibility, Postural dysfunction, Pain  Visit Diagnosis: Chronic right shoulder pain  Spondylosis of cervical region without myelopathy or radiculopathy     Problem List Patient Active Problem List   Diagnosis Date Noted  . [place] 10/17/2017  . Spondylosis of cervical spine 10/17/2017  . Chronic right shoulder pain 10/17/2017      SLaureen Abrahams PT, DPT 12/03/17 11:58 AM     [facility] [address]Phone: [phone]  Fax:  [phone] Name: Anna DazeyMRN: [id number] of Birth: [birth year]

## 2017-12-05 ENCOUNTER — Encounter: Payer: Self-pay | Admitting: Physical Therapy

## 2017-12-05 ENCOUNTER — Ambulatory Visit (INDEPENDENT_AMBULATORY_CARE_PROVIDER_SITE_OTHER): Payer: Medicare Other | Admitting: Physical Therapy

## 2017-12-05 DIAGNOSIS — R5383 Other fatigue: Secondary | ICD-10-CM | POA: Diagnosis not present

## 2017-12-05 DIAGNOSIS — M25511 Pain in right shoulder: Secondary | ICD-10-CM

## 2017-12-05 DIAGNOSIS — M47812 Spondylosis without myelopathy or radiculopathy, cervical region: Secondary | ICD-10-CM | POA: Diagnosis not present

## 2017-12-05 DIAGNOSIS — G8929 Other chronic pain: Secondary | ICD-10-CM

## 2017-12-05 DIAGNOSIS — R252 Cramp and spasm: Secondary | ICD-10-CM | POA: Diagnosis not present

## 2017-12-05 DIAGNOSIS — G35 Multiple sclerosis: Secondary | ICD-10-CM | POA: Diagnosis not present

## 2017-12-05 DIAGNOSIS — G2581 Restless legs syndrome: Secondary | ICD-10-CM | POA: Diagnosis not present

## 2017-12-05 NOTE — Therapy (Signed)
[facility] [place] [address] [place], [place], [address] Phone: [phone]   Fax:  [phone]  Physical Therapy Treatment  Patient Details  Name: Anna Carpenter MRN: [id number] Date of Birth: 08-30-1945 Referring Provider: Dr. Karie Schwalbe   Encounter Date: 12/05/2017  PT End of Session - 12/05/17 1451    Visit Number  14    Number of Visits  20    Date for PT Re-Evaluation  12/31/17    Authorization Type  Medicare    PT Start Time  1400    PT Stop Time  1440    PT Time Calculation (min)  40 min    Activity Tolerance  Patient tolerated treatment well    Behavior During Therapy  [facility] for tasks assessed/performed       Past Medical History:  Diagnosis Date  . Alopecia   . Hypothyroidism   . IBS (irritable bowel syndrome)   . Lactose intolerance   . Mitral valve regurgitation   . Multiple sclerosis ([facility])   . Raynaud's disease     Past Surgical History:  Procedure Laterality Date  . ABDOMINAL HYSTERECTOMY    . CHOLECYSTECTOMY    . KNEE SURGERY    . TOTAL HIP ARTHROPLASTY      There were no vitals filed for this visit.  Subjective Assessment - 12/05/17 1402    Subjective  today is a good day; pain is less    Patient Stated Goals  stop the popping in her Rt shoulder    Currently in Pain?  Yes    Pain Score  4     Pain Location  Shoulder    Pain Orientation  Right    Pain Descriptors / Indicators  Sharp;Tightness    Pain Type  Chronic pain    Pain Onset  More than a month ago    Pain Frequency  Intermittent    Aggravating Factors   reaching    Pain Relieving Factors  rest, PT                       [facility] Adult PT Treatment/Exercise - 12/05/17 1403      Exercises   Exercises  Shoulder      Neck Exercises: Machines for Strengthening   UBE (Upper Arm Bike)  L2 x 4 min; 2/2      Shoulder Exercises: Supine   Protraction  Right;20 reps;Weights    Protraction Weight (lbs)  3      Shoulder  Exercises: Standing   Row  Strengthening;20 reps    Theraband Level (Shoulder Row)  Level 2 (Red)      Shoulder Exercises: Stretch   Other Shoulder Stretches  doorway stretch 30 sec X2 low and mid      Manual Therapy   Manual Therapy  Joint mobilization    Joint Mobilization  Rt shoulder gentle LAD and inf and A/P mobs grades 2-3; instructed in self mobilization at home as pt reported decreased pain and improved ROM following manual therapy    Soft tissue mobilization  Rt post/ant shoulder including LS, UT, biceps and pecs             PT Education - 12/05/17 1450    Education Details  self mobilization for Rt shoulder    Person(s) Educated  Patient    Methods  Explanation;Demonstration;Handout    Comprehension  Verbalized understanding;Returned demonstration;Need further instruction          PT Long  Term Goals - 12/03/17 1155      PT LONG TERM GOAL #1   Title  Pt will be I and compliant with HEP. 6 weeks 11/29/17    Status  Achieved      PT LONG TERM GOAL #2   Title  Pt will improve cervical and Rt shoulder AROM to [facility] to perform reaching.     Status  On-going    Target Date  12/31/17      PT LONG TERM GOAL #3   Title  Pt will improve Rt shoulder strength to at least 4+/5 to increase funciton. 6 weeks 11/29/17    Status  Achieved      PT LONG TERM GOAL #4   Title  Pt will relay overall less than 3/10 neck/shoulder pain with all ADL's.     Status  On-going    Target Date  12/31/17            Plan - 12/05/17 1451    Clinical Impression Statement  Pt reported decreased pain and more ROM following manual therapy today and instructed in how to perform at home.  Pt with slight improvements today and hopeful pt will see some carryover to next session.  Plans to follow up with MD next week.    Rehab Potential  Fair    Clinical Impairments Affecting Rehab Potential  MS    PT Frequency  2x / week    PT Duration  6 weeks    PT Treatment/Interventions  ADLs/Self Care  Home Management;Cryotherapy;Electrical Stimulation;Iontophoresis 4mg /ml Dexamethasone;Moist Heat;Traction;Ultrasound;Therapeutic exercise;Neuromuscular re-education;Manual techniques;Passive range of motion;Dry needling    PT Next Visit Plan  continue posture, manual, modalities PRN    Consulted and Agree with Plan of Care  Patient       Patient will benefit from skilled therapeutic intervention in order to improve the following deficits and impairments:  Decreased activity tolerance, Decreased endurance, Decreased strength, Hypomobility, Increased muscle spasms, Impaired flexibility, Postural dysfunction, Pain  Visit Diagnosis: Chronic right shoulder pain  Spondylosis of cervical region without myelopathy or radiculopathy     Problem List Patient Active Problem List   Diagnosis Date Noted  . Multiple sclerosis ([facility]) 10/17/2017  . Spondylosis of cervical spine 10/17/2017  . Chronic right shoulder pain 10/17/2017      Clarita Crane, PT, DPT 12/05/17 2:52 PM    [facility] [address] [place], [place], [address] Phone: [phone]   Fax:  [phone]  Name: Bijal Siglin MRN: [id number] Date of Birth: Mar 17, 1946

## 2017-12-05 NOTE — Patient Instructions (Signed)
Access Code: [email or web address]  URL: [email or web address]  Date: 12/05/2017  Prepared by: Moshe Cipro   Exercises  Seated Shoulder Inferior Glide - 5-10 reps - 1 sets - 10 sec hold - 2x daily - 7x weekly

## 2017-12-11 ENCOUNTER — Encounter: Payer: Self-pay | Admitting: Physical Therapy

## 2017-12-11 ENCOUNTER — Ambulatory Visit (INDEPENDENT_AMBULATORY_CARE_PROVIDER_SITE_OTHER): Payer: Medicare Other | Admitting: Physical Therapy

## 2017-12-11 DIAGNOSIS — G35 Multiple sclerosis: Secondary | ICD-10-CM

## 2017-12-11 DIAGNOSIS — M25511 Pain in right shoulder: Secondary | ICD-10-CM

## 2017-12-11 DIAGNOSIS — M47812 Spondylosis without myelopathy or radiculopathy, cervical region: Secondary | ICD-10-CM

## 2017-12-11 DIAGNOSIS — G8929 Other chronic pain: Secondary | ICD-10-CM | POA: Diagnosis not present

## 2017-12-11 NOTE — Therapy (Addendum)
[address] [place], [place], [address] Phone: [phone]   Fax:  [phone]  Physical Therapy Treatment/Discharge  Patient Details  Name: Anna Carpenter MRN: [id number] Date of Birth: [birth year] Referring Provider: Dr. Darene Lamer   Encounter Date: 12/11/2017  PT End of Session - 12/11/17 1602    Visit Number  15    Number of Visits  20    Date for PT Re-Evaluation  12/31/17    Authorization Type  Medicare    PT Start Time  5883    PT Stop Time  1556    PT Time Calculation (min)  40 min    Activity Tolerance  Patient tolerated treatment well    Behavior During Therapy  [facility] for tasks assessed/performed       Past Medical History:  Diagnosis Date  . Alopecia   . Hypothyroidism   . IBS (irritable bowel syndrome)   . Lactose intolerance   . Mitral valve regurgitation   . Multiple sclerosis ([place])   . Raynaud's disease     Past Surgical History:  Procedure Laterality Date  . ABDOMINAL HYSTERECTOMY    . CHOLECYSTECTOMY    . KNEE SURGERY    . TOTAL HIP ARTHROPLASTY      There were no vitals filed for this visit.  Subjective Assessment - 12/11/17 1520    Subjective  played cards this weekend and no episodes of popping or snapping    Diagnostic tests  C spine X-rays "Multilevel osteoarthritic change. Mild spondylolisthesis at C7-T1, likely due to underlying spondylosis. No other spondylolisthesis. Rt shoulder x ray show no fracture but Mild osteoarthritic changes of the glenohumeral and    Patient Stated Goals  stop the popping in her Rt shoulder    Currently in Pain?  Yes  (Pended)     Pain Score  4   (Pended)     Pain Location  Shoulder  (Pended)     Pain Orientation  Right  (Pended)     Pain Descriptors / Indicators  Sharp;Tightness  (Pended)     Pain Type  Chronic pain  (Pended)     Pain Onset  More than a month ago    Aggravating Factors   reaching  (Pended)          [facility] PT Assessment -  12/11/17 1544      AROM   Right Shoulder Flexion  130 Degrees   161 with pain   Right Shoulder ABduction  143 Degrees   174 with pain     Strength   Right Shoulder Flexion  5/5    Right Shoulder ABduction  5/5    Right Shoulder Internal Rotation  5/5    Right Shoulder External Rotation  4/5                   [facility] Adult PT Treatment/Exercise - 12/11/17 1522      Neck Exercises: Machines for Strengthening   UBE (Upper Arm Bike)  L2 x 4 min; 2/2      Shoulder Exercises: Standing   External Rotation  Strengthening;Right;Other (comment);20 reps   cues for proper technique   Theraband Level (Shoulder External Rotation)  Level 2 (Red)      Manual Therapy   Joint Mobilization  skilled palpation and monitoring of soft tissue during DN    Soft tissue mobilization  STM to Rt pecs and upper trap/levator scapulae       Trigger Point Dry Needling -  12/11/17 1559    Consent Given?  Yes    Education Handout Provided  No   will provide next session; verbally reviewed   Muscles Treated Upper Body  Upper trapezius    Upper Trapezius Response  Twitch reponse elicited;Palpable increased muscle length                PT Long Term Goals - 12/11/17 1559      PT LONG TERM GOAL #1   Title  Pt will be I and compliant with HEP. 6 weeks 11/29/17    Status  Achieved      PT LONG TERM GOAL #2   Title  Pt will improve cervical and Rt shoulder AROM to [facility] to perform reaching.     Baseline  9/4: shoulder met with pain    Status  Partially Met      PT LONG TERM GOAL #3   Title  Pt will improve Rt shoulder strength to at least 4+/5 to increase funciton. 6 weeks 11/29/17    Status  Achieved      PT LONG TERM GOAL #4   Title  Pt will relay overall less than 3/10 neck/shoulder pain with all ADL's.     Baseline  9/4: avg pain 4/10    Status  On-going            Plan - 12/11/17 1600    Clinical Impression Statement  Pt with improving ROM but continued to have pain with  near end ranges of pain, but is able to push through pain to WNL.  Overall slowly progressing, but pain seems to stay around 4/10 without significant improvement.  Pt to follow up with MD 12/12/17.    Rehab Potential  Fair    Clinical Impairments Affecting Rehab Potential  MS    PT Frequency  2x / week    PT Duration  6 weeks    PT Treatment/Interventions  ADLs/Self Care Home Management;Cryotherapy;Electrical Stimulation;Iontophoresis 8m/ml Dexamethasone;Moist Heat;Traction;Ultrasound;Therapeutic exercise;Neuromuscular re-education;Manual techniques;Passive range of motion;Dry needling    PT Next Visit Plan  follow up after MD appt, continue per his recommendations    Consulted and Agree with Plan of Care  Patient       Patient will benefit from skilled therapeutic intervention in order to improve the following deficits and impairments:  Decreased activity tolerance, Decreased endurance, Decreased strength, Hypomobility, Increased muscle spasms, Impaired flexibility, Postural dysfunction, Pain  Visit Diagnosis: Chronic right shoulder pain  Spondylosis of cervical region without myelopathy or radiculopathy  Multiple sclerosis ([facility]     Problem List Patient Active Problem List   Diagnosis Date Noted  . [place] 10/17/2017  . Spondylosis of cervical spine 10/17/2017  . Chronic right shoulder pain 10/17/2017      SLaureen Abrahams PT, DPT 12/11/17 4:03 PM    [facility] [address] [address]Phone: [phone]  Fax:  [phone] Name: Anna FulbrightMRN: [id number] of Birth: [birth year]     PHYSICAL THERAPY DISCHARGE SUMMARY  Visits from Start of Care: 15  Current functional level related to goals / functional outcomes: See above   Remaining deficits: See above   Education / Equipment: HEP  Plan: Patient agrees to discharge.  Patient goals were partially  met. Patient is being discharged due to lack of progress.  Pt demonstrated improvements with PT, but continued to have episodes of pain; therefore recommended return to MD.?????  Laureen Abrahams, PT, DPT 01/10/18 12:36 PM  [facility] [address] [place] [address] [place], [place] [address]  [phone] (office) [phone] (fax)

## 2017-12-12 ENCOUNTER — Ambulatory Visit (INDEPENDENT_AMBULATORY_CARE_PROVIDER_SITE_OTHER): Payer: Medicare Other | Admitting: Sports Medicine

## 2017-12-12 ENCOUNTER — Encounter: Payer: Self-pay | Admitting: Sports Medicine

## 2017-12-12 DIAGNOSIS — M25511 Pain in right shoulder: Secondary | ICD-10-CM | POA: Diagnosis not present

## 2017-12-12 DIAGNOSIS — G8929 Other chronic pain: Secondary | ICD-10-CM

## 2017-12-12 MED ORDER — CELECOXIB 200 MG PO CAPS: ORAL_CAPSULE | ORAL | 2 refills | Status: DC

## 2017-12-12 NOTE — Assessment & Plan Note (Signed)
Bicipital and glenohumeral pain. Ultrasound did show significant glenohumeral degenerative changes, bicipital fraying, subscapularis and, articular sided supraspinatus fraying. Plateaued with therapy, adding Celebrex and topical Pennsaid (diclofenac 2% topical). Return in a month, glenohumeral injection if no better. Continues to desire to avoid a pharmacologic approach.

## 2017-12-12 NOTE — Progress Notes (Signed)
Subjective:    CC: Recheck shoulder pain  HPI: This is a pleasant 72 year old female with chronic left shoulder pain, ultrasound did show some some fraying of the biceps tendon, as well as rotator cuff tendinopathy.  She has responded extremely well to formal physical therapy, still has a bit of discomfort and is agreeable to try pharmacologic regimen before considering intervention.  Symptoms are moderate, persistent without radiation past the elbow.  I reviewed the past medical history, family history, social history, surgical history, and allergies today and no changes were needed.  Please see the problem list section below in epic for further details.  Past Medical History: Past Medical History:  Diagnosis Date  . Alopecia   . Hypothyroidism   . IBS (irritable bowel syndrome)   . Lactose intolerance   . Mitral valve regurgitation   . Multiple sclerosis ([facility])   . Raynaud's disease    Past Surgical History: Past Surgical History:  Procedure Laterality Date  . ABDOMINAL HYSTERECTOMY    . CHOLECYSTECTOMY    . KNEE SURGERY    . TOTAL HIP ARTHROPLASTY     Social History: Social History   Socioeconomic History  . Marital status: Married    Spouse name: Not on file  . Number of children: Not on file  . Years of education: Not on file  . Highest education level: Not on file  Occupational History  . Not on file  Social Needs  . Financial resource strain: Not on file  . Food insecurity:    Worry: Not on file    Inability: Not on file  . Transportation needs:    Medical: Not on file    Non-medical: Not on file  Tobacco Use  . Smoking status: Former Games developer  . Smokeless tobacco: Never Used  Substance and Sexual Activity  . Alcohol use: No  . Drug use: No  . Sexual activity: Not Currently  Lifestyle  . Physical activity:    Days per week: Not on file    Minutes per session: Not on file  . Stress: Not on file  Relationships  . Social connections:    Talks on phone:  Not on file    Gets together: Not on file    Attends religious service: Not on file    Active member of club or organization: Not on file    Attends meetings of clubs or organizations: Not on file    Relationship status: Not on file  Other Topics Concern  . Not on file  Social History Narrative  . Not on file   Family History: Family History  Problem Relation Age of Onset  . Hypertension Mother   . Cancer Mother        Ovarian   Allergies: Allergies  Allergen Reactions  . Gabapentin   . Macrobid [employer] Macro]   . Nsaids   . Sulfa Antibiotics   . Tetracyclines & Related   . Thimerosal    Medications: See med rec.  Review of Systems: No fevers, chills, night sweats, weight loss, chest pain, or shortness of breath.   Objective:    General: Well Developed, well nourished, and in no acute distress.  Neuro: Alert and oriented x3, extra-ocular muscles intact, sensation grossly intact.  HEENT: Normocephalic, atraumatic, pupils equal round reactive to light, neck supple, no masses, no lymphadenopathy, thyroid nonpalpable.  Skin: Warm and dry, no rashes. Cardiac: Regular rate and rhythm, no murmurs rubs or gallops, no lower extremity edema.  Respiratory: Clear  to auscultation bilaterally. Not using accessory muscles, speaking in full sentences.  Impression and Recommendations:    Chronic right shoulder pain Bicipital and glenohumeral pain. Ultrasound did show significant glenohumeral degenerative changes, bicipital fraying, subscapularis and, articular sided supraspinatus fraying. Plateaued with therapy, adding Celebrex and topical Pennsaid (diclofenac 2% topical). Return in a month, glenohumeral injection if no better. Continues to desire to avoid a pharmacologic approach. ___________________________________________ Ihor Austin. Benjamin Stain, M.D., ABFM., CAQSM. Primary Care and Sports Medicine [facility]  Adjunct Instructor of Family  Medicine  [employer]

## 2018-01-05 DIAGNOSIS — Z23 Encounter for immunization: Secondary | ICD-10-CM | POA: Diagnosis not present

## 2018-01-09 ENCOUNTER — Ambulatory Visit (INDEPENDENT_AMBULATORY_CARE_PROVIDER_SITE_OTHER): Payer: Medicare Other | Admitting: Sports Medicine

## 2018-01-09 ENCOUNTER — Encounter: Payer: Self-pay | Admitting: Sports Medicine

## 2018-01-09 DIAGNOSIS — G8929 Other chronic pain: Secondary | ICD-10-CM

## 2018-01-09 DIAGNOSIS — M25511 Pain in right shoulder: Secondary | ICD-10-CM

## 2018-01-09 NOTE — Progress Notes (Signed)
Subjective:    CC: Shoulder pain  HPI: This is a pleasant 72 year old female here for follow-up of her shoulder pain, she did some exercises, used Pennsaid (diclofenac 2% topical), had a small improvement but continues to have discomfort.  She never did her Celebrex.  Pain is moderate, persistent, localized at the joint line of the right shoulder, now a little bit in the left shoulder as well without radiation.  I reviewed the past medical history, family history, social history, surgical history, and allergies today and no changes were needed.  Please see the problem list section below in epic for further details.  Past Medical History: Past Medical History:  Diagnosis Date  . Alopecia   . Hypothyroidism   . IBS (irritable bowel syndrome)   . Lactose intolerance   . Mitral valve regurgitation   . Multiple sclerosis ([facility])   . Raynaud's disease    Past Surgical History: Past Surgical History:  Procedure Laterality Date  . ABDOMINAL HYSTERECTOMY    . CHOLECYSTECTOMY    . KNEE SURGERY    . TOTAL HIP ARTHROPLASTY     Social History: Social History   Socioeconomic History  . Marital status: Married    Spouse name: Not on file  . Number of children: Not on file  . Years of education: Not on file  . Highest education level: Not on file  Occupational History  . Not on file  Social Needs  . Financial resource strain: Not on file  . Food insecurity:    Worry: Not on file    Inability: Not on file  . Transportation needs:    Medical: Not on file    Non-medical: Not on file  Tobacco Use  . Smoking status: Former Games developer  . Smokeless tobacco: Never Used  Substance and Sexual Activity  . Alcohol use: No  . Drug use: No  . Sexual activity: Not Currently  Lifestyle  . Physical activity:    Days per week: Not on file    Minutes per session: Not on file  . Stress: Not on file  Relationships  . Social connections:    Talks on phone: Not on file    Gets together: Not on  file    Attends religious service: Not on file    Active member of club or organization: Not on file    Attends meetings of clubs or organizations: Not on file    Relationship status: Not on file  Other Topics Concern  . Not on file  Social History Narrative  . Not on file   Family History: Family History  Problem Relation Age of Onset  . Hypertension Mother   . Cancer Mother        Ovarian   Allergies: Allergies  Allergen Reactions  . Gabapentin   . Macrobid [employer] Macro]   . Nsaids   . Sulfa Antibiotics   . Tetracyclines & Related   . Thimerosal    Medications: See med rec.  Review of Systems: No fevers, chills, night sweats, weight loss, chest pain, or shortness of breath.   Objective:    General: Well Developed, well nourished, and in no acute distress.  Neuro: Alert and oriented x3, extra-ocular muscles intact, sensation grossly intact.  HEENT: Normocephalic, atraumatic, pupils equal round reactive to light, neck supple, no masses, no lymphadenopathy, thyroid nonpalpable.  Skin: Warm and dry, no rashes. Cardiac: Regular rate and rhythm, no murmurs rubs or gallops, no lower extremity edema.  Respiratory: Clear to  auscultation bilaterally. Not using accessory muscles, speaking in full sentences.  Impression and Recommendations:    Chronic right shoulder pain Mostly glenohumeral pain. She has not yet taken the Celebrex, Pennsaid (diclofenac 2% topical) only provides minimal relief. Declines injection today. She will actually take the Celebrex this time, she has developed a similar pain in the left shoulder. If persistent discomfort with Celebrex in 1 month we will do a glenohumeral injection. Of note she likes to go by the nickname "[name]." ___________________________________________ Ihor Austin. Benjamin Stain, M.D., ABFM., CAQSM. Primary Care and Sports Medicine [facility]  Adjunct Instructor of Family Medicine    [employer]

## 2018-01-09 NOTE — Assessment & Plan Note (Signed)
Mostly glenohumeral pain. She has not yet taken the Celebrex, Pennsaid (diclofenac 2% topical) only provides minimal relief. Declines injection today. She will actually take the Celebrex this time, she has developed a similar pain in the left shoulder. If persistent discomfort with Celebrex in 1 month we will do a glenohumeral injection. Of note she likes to go by the nickname "[name]."

## 2018-02-07 ENCOUNTER — Ambulatory Visit: Payer: Medicare Other | Admitting: Sports Medicine

## 2018-02-20 DIAGNOSIS — B373 Candidiasis of vulva and vagina: Secondary | ICD-10-CM | POA: Diagnosis not present

## 2018-02-20 DIAGNOSIS — Z780 Asymptomatic menopausal state: Secondary | ICD-10-CM | POA: Diagnosis not present

## 2018-02-20 DIAGNOSIS — N898 Other specified noninflammatory disorders of vagina: Secondary | ICD-10-CM | POA: Diagnosis not present

## 2018-02-20 DIAGNOSIS — E782 Mixed hyperlipidemia: Secondary | ICD-10-CM | POA: Diagnosis not present

## 2018-02-20 DIAGNOSIS — Z0000 Encounter for general adult medical examination without abnormal findings: Secondary | ICD-10-CM | POA: Diagnosis not present

## 2018-02-20 DIAGNOSIS — E039 Hypothyroidism, unspecified: Secondary | ICD-10-CM | POA: Diagnosis not present

## 2018-02-20 DIAGNOSIS — L739 Follicular disorder, unspecified: Secondary | ICD-10-CM | POA: Diagnosis not present

## 2018-02-24 DIAGNOSIS — H26492 Other secondary cataract, left eye: Secondary | ICD-10-CM | POA: Diagnosis not present

## 2018-02-24 DIAGNOSIS — H01004 Unspecified blepharitis left upper eyelid: Secondary | ICD-10-CM | POA: Diagnosis not present

## 2018-02-24 DIAGNOSIS — H52221 Regular astigmatism, right eye: Secondary | ICD-10-CM | POA: Diagnosis not present

## 2018-02-24 DIAGNOSIS — H469 Unspecified optic neuritis: Secondary | ICD-10-CM | POA: Diagnosis not present

## 2018-02-24 DIAGNOSIS — H527 Unspecified disorder of refraction: Secondary | ICD-10-CM | POA: Diagnosis not present

## 2018-02-24 DIAGNOSIS — H538 Other visual disturbances: Secondary | ICD-10-CM | POA: Diagnosis not present

## 2018-02-24 DIAGNOSIS — H01001 Unspecified blepharitis right upper eyelid: Secondary | ICD-10-CM | POA: Diagnosis not present

## 2018-02-24 DIAGNOSIS — H43813 Vitreous degeneration, bilateral: Secondary | ICD-10-CM | POA: Diagnosis not present

## 2018-02-24 DIAGNOSIS — H16223 Keratoconjunctivitis sicca, not specified as Sjogren's, bilateral: Secondary | ICD-10-CM | POA: Diagnosis not present

## 2018-02-24 DIAGNOSIS — H53002 Unspecified amblyopia, left eye: Secondary | ICD-10-CM | POA: Diagnosis not present

## 2018-02-24 DIAGNOSIS — H25811 Combined forms of age-related cataract, right eye: Secondary | ICD-10-CM | POA: Diagnosis not present

## 2018-02-24 DIAGNOSIS — Z961 Presence of intraocular lens: Secondary | ICD-10-CM | POA: Diagnosis not present

## 2018-02-28 DIAGNOSIS — Z1231 Encounter for screening mammogram for malignant neoplasm of breast: Secondary | ICD-10-CM | POA: Diagnosis not present

## 2018-03-11 DIAGNOSIS — M85832 Other specified disorders of bone density and structure, left forearm: Secondary | ICD-10-CM | POA: Diagnosis not present

## 2018-03-11 DIAGNOSIS — Z780 Asymptomatic menopausal state: Secondary | ICD-10-CM | POA: Diagnosis not present

## 2018-03-11 DIAGNOSIS — M85822 Other specified disorders of bone density and structure, left upper arm: Secondary | ICD-10-CM | POA: Diagnosis not present

## 2018-04-07 DIAGNOSIS — G35 Multiple sclerosis: Secondary | ICD-10-CM | POA: Diagnosis not present

## 2018-04-16 DIAGNOSIS — N898 Other specified noninflammatory disorders of vagina: Secondary | ICD-10-CM | POA: Diagnosis not present

## 2019-02-09 ENCOUNTER — Encounter: Payer: Self-pay | Admitting: Sports Medicine

## 2019-02-09 ENCOUNTER — Other Ambulatory Visit: Payer: Self-pay

## 2019-02-09 ENCOUNTER — Ambulatory Visit (INDEPENDENT_AMBULATORY_CARE_PROVIDER_SITE_OTHER): Payer: Medicare Other

## 2019-02-09 ENCOUNTER — Ambulatory Visit (INDEPENDENT_AMBULATORY_CARE_PROVIDER_SITE_OTHER): Payer: Medicare Other | Admitting: Sports Medicine

## 2019-02-09 DIAGNOSIS — M25561 Pain in right knee: Secondary | ICD-10-CM

## 2019-02-09 DIAGNOSIS — M11262 Other chondrocalcinosis, left knee: Secondary | ICD-10-CM

## 2019-02-09 DIAGNOSIS — M170 Bilateral primary osteoarthritis of knee: Secondary | ICD-10-CM | POA: Insufficient documentation

## 2019-02-09 DIAGNOSIS — M11261 Other chondrocalcinosis, right knee: Secondary | ICD-10-CM

## 2019-02-09 DIAGNOSIS — M1711 Unilateral primary osteoarthritis, right knee: Secondary | ICD-10-CM | POA: Insufficient documentation

## 2019-02-09 MED ORDER — TRAMADOL HCL 50 MG PO TABS: 50.0000 mg | ORAL_TABLET | Freq: Three times a day (TID) | ORAL | 0 refills | Status: DC | PRN

## 2019-02-09 NOTE — Assessment & Plan Note (Addendum)
Acute medial joint line pain, unable to bear weight, mechanical symptoms, locks at 5 degrees extension. Suspect medial meniscal tear, x-rays, MRI, tramadol.  MRI shows osteoarthritis, meniscal tearing, she will return tomorrow for aspiration and injection.

## 2019-02-09 NOTE — Progress Notes (Signed)
Subjective:    CC: Severe R knee pain  HPI: Anna Carpenter is a pleasant 73 year old with a history significant for MS and cervical spondylosis who presents today 2 days after a rotational injury eliciting severe pain in her R knee with referral to her thigh/lower back and lower leg. Her pain has progressed She denies any tearing/ popping sensation She has never experienced similar symptoms. She reports constant pain in every knee position and identifies the focus of her pain over the patellar tendon. She has needed to utilize a walker in order to ambulate. She denies any falls.  I reviewed the past medical history, family history, social history, surgical history, and allergies today and no changes were needed.  Please see the problem list section below in epic for further details.  Past Medical History: Past Medical History:  Diagnosis Date  . Alopecia   . Hypothyroidism   . IBS (irritable bowel syndrome)   . Lactose intolerance   . Mitral valve regurgitation   . Multiple sclerosis ([place])   . Raynaud's disease    Past Surgical History: Past Surgical History:  Procedure Laterality Date  . ABDOMINAL HYSTERECTOMY    . CHOLECYSTECTOMY    . KNEE SURGERY    . TOTAL HIP ARTHROPLASTY     Social History: Social History   Socioeconomic History  . Marital status: Married    Spouse name: Not on file  . Number of children: Not on file  . Years of education: Not on file  . Highest education level: Not on file  Occupational History  . Not on file  Social Needs  . Financial resource strain: Not on file  . Food insecurity    Worry: Not on file    Inability: Not on file  . Transportation needs    Medical: Not on file    Non-medical: Not on file  Tobacco Use  . Smoking status: Former Research scientist (life sciences)  . Smokeless tobacco: Never Used  Substance and Sexual Activity  . Alcohol use: No  . Drug use: No  . Sexual activity: Not Currently  Lifestyle  . Physical activity    Days per week: Not on file   Minutes per session: Not on file  . Stress: Not on file  Relationships  . Social Herbalist on phone: Not on file    Gets together: Not on file    Attends religious service: Not on file    Active member of club or organization: Not on file    Attends meetings of clubs or organizations: Not on file    Relationship status: Not on file  Other Topics Concern  . Not on file  Social History Narrative  . Not on file   Family History: Family History  Problem Relation Age of Onset  . Hypertension Mother   . Cancer Mother        Ovarian   Allergies: Allergies  Allergen Reactions  . Gabapentin   . Macrobid [employer] Macro]   . Nsaids   . Sulfa Antibiotics   . Tetracyclines & Related   . Thimerosal    Medications: See med rec.  Review of Systems: No fevers, chills, night sweats, weight loss, chest pain, or shortness of breath.   Objective:    General: Well Developed, well nourished, gait dependent on walker. Neuro: Alert and oriented x3, extra-ocular muscles intact, sensation grossly intact.  HEENT: Normocephalic, atraumatic, pupils equal round reactive to light.  Skin: Warm and dry, no rashes.  Cardiac: Regular rate and rhythm. Respiratory: Not using accessory muscles, speaking in full sentences.  L Knee: Effusion present on inspection no obvious bony abnormalities. Warmth and joint line tenderness present on palpation. ROM limited by pain in all directions. Positive Mcmurray's. Patellar glide without crepitus. Patellar and quadriceps tendons unremarkable. Hamstring and quadriceps strength exam limited by pain.  A/P: Anna Carpenter is experiencing progressive severe and diffuse knee pain secondary to a rotational injury in her home. She is unable to ambulate without an assistive device. Her medial joint line tenderness and positive McMurray's sign in the setting of a rotational injury points towards a tear of the meniscus. She has been sent for X-ray and MRI  imaging to confirm diagnosis and guide treatment. Will plan to inject knee if meniscal tear is confirmed.  Impression and Recommendations:    Acute pain of right knee Acute medial joint line pain, unable to bear weight, mechanical symptoms, locks at 5 degrees extension. Suspect medial meniscal tear, x-rays, MRI, tramadol.   ___________________________________________ Ihor Austin. Benjamin Stain, M.D., ABFM., CAQSM. Primary Care and Sports Medicine [facility]  Adjunct Professor of Family Medicine  [employer]

## 2019-02-11 ENCOUNTER — Other Ambulatory Visit: Payer: Self-pay

## 2019-02-11 ENCOUNTER — Ambulatory Visit (INDEPENDENT_AMBULATORY_CARE_PROVIDER_SITE_OTHER): Payer: Medicare Other | Admitting: Sports Medicine

## 2019-02-11 DIAGNOSIS — M1711 Unilateral primary osteoarthritis, right knee: Secondary | ICD-10-CM

## 2019-02-11 NOTE — Assessment & Plan Note (Signed)
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 Aspiration of 65 cc of frank blood, injection. Return to see me in 4 weeks.

## 2019-02-11 NOTE — Progress Notes (Signed)
Subjective:    CC: Right knee pain  HPI: Anna Carpenter returns, she is a 73 year old with chronic right knee pain, her MRI showed arthritis and meniscal tearing.  She also had significant effusion, pain is moderate, persistent, localized at the medial joint line without much radiation.  It does make it significantly difficult for her to ambulate.  I reviewed the past medical history, family history, social history, surgical history, and allergies today and no changes were needed.  Please see the problem list section below in epic for further details.  Past Medical History: Past Medical History:  Diagnosis Date  . Alopecia   . Hypothyroidism   . IBS (irritable bowel syndrome)   . Lactose intolerance   . Mitral valve regurgitation   . Multiple sclerosis ([place])   . Raynaud's disease    Past Surgical History: Past Surgical History:  Procedure Laterality Date  . ABDOMINAL HYSTERECTOMY    . CHOLECYSTECTOMY    . KNEE SURGERY    . TOTAL HIP ARTHROPLASTY     Social History: Social History   Socioeconomic History  . Marital status: Married    Spouse name: Not on file  . Number of children: Not on file  . Years of education: Not on file  . Highest education level: Not on file  Occupational History  . Not on file  Social Needs  . Financial resource strain: Not on file  . Food insecurity    Worry: Not on file    Inability: Not on file  . Transportation needs    Medical: Not on file    Non-medical: Not on file  Tobacco Use  . Smoking status: Former Research scientist (life sciences)  . Smokeless tobacco: Never Used  Substance and Sexual Activity  . Alcohol use: No  . Drug use: No  . Sexual activity: Not Currently  Lifestyle  . Physical activity    Days per week: Not on file    Minutes per session: Not on file  . Stress: Not on file  Relationships  . Social Herbalist on phone: Not on file    Gets together: Not on file    Attends religious service: Not on file    Active member of club or  organization: Not on file    Attends meetings of clubs or organizations: Not on file    Relationship status: Not on file  Other Topics Concern  . Not on file  Social History Narrative  . Not on file   Family History: Family History  Problem Relation Age of Onset  . Hypertension Mother   . Cancer Mother        Ovarian   Allergies: Allergies  Allergen Reactions  . Gabapentin   . Macrobid [employer] Macro]   . Nsaids   . Sulfa Antibiotics   . Tetracyclines & Related   . Thimerosal    Medications: See med rec.  Review of Systems: No fevers, chills, night sweats, weight loss, chest pain, or shortness of breath.   Objective:    General: Well Developed, well nourished, and in no acute distress.  Neuro: Alert and oriented x3, extra-ocular muscles intact, sensation grossly intact.  HEENT: Normocephalic, atraumatic, pupils equal round reactive to light, neck supple, no masses, no lymphadenopathy, thyroid nonpalpable.  Skin: Warm and dry, no rashes. Cardiac: Regular rate and rhythm, no murmurs rubs or gallops, no lower extremity edema.  Respiratory: Clear to auscultation bilaterally. Not using accessory muscles, speaking in full sentences. Knee: Normal to inspection  with no erythema or effusion or obvious bony abnormalities. Palpation normal with no warmth or joint line tenderness or patellar tenderness or condyle tenderness. ROM normal in flexion and extension and lower leg rotation. Ligaments with solid consistent endpoints including ACL, PCL, LCL, MCL. Negative Mcmurray's and provocative meniscal tests. Non painful patellar compression. Patellar and quadriceps tendons unremarkable. Hamstring and quadriceps strength is normal.  Procedure: Real-time Ultrasound Guided  aspiration/injection of right knee Device: GE Logiq E  Verbal informed consent obtained.  Time-out conducted.  Noted no overlying erythema, induration, or other signs of local infection.  Skin  prepped in a sterile fashion.  Local anesthesia: Topical Ethyl chloride.  With sterile technique and under real time ultrasound guidance:  I advanced an 18-gauge needle into the suprapatellar recess and aspirated 65 cc of frank blood, I then injected 1 cc Kenalog 40, 2 cc lidocaine, 2 cc bupivacaine. Completed without difficulty  Pain immediately resolved suggesting accurate placement of the medication.  Advised to call if fevers/chills, erythema, induration, drainage, or persistent bleeding.  Images permanently stored and available for review in the ultrasound unit.  Impression: Technically successful ultrasound guided injection.  Impression and Recommendations:    Primary osteoarthritis of right knee Tricompartmental osteoarthritis with degenerative meniscal tearing on MRI. Aspiration of 65 cc of frank blood, injection. Return to see me in 4 weeks.   ___________________________________________ Ihor Austin. Benjamin Stain, M.D., ABFM., CAQSM. Primary Care and Sports Medicine [place] [facility]  Adjunct Professor of Family Medicine  [employer]

## 2019-03-13 ENCOUNTER — Other Ambulatory Visit: Payer: Self-pay

## 2019-03-13 ENCOUNTER — Encounter: Payer: Self-pay | Admitting: Sports Medicine

## 2019-03-13 ENCOUNTER — Ambulatory Visit (INDEPENDENT_AMBULATORY_CARE_PROVIDER_SITE_OTHER): Payer: Medicare Other | Admitting: Sports Medicine

## 2019-03-13 DIAGNOSIS — M1711 Unilateral primary osteoarthritis, right knee: Secondary | ICD-10-CM | POA: Diagnosis not present

## 2019-03-13 NOTE — Assessment & Plan Note (Signed)
Tricompartmental arthritis and degenerative meniscal tearing on MRI as expected, at the last visit we did aspirated 65 cc of frank blood and injected the knee, she returns today doing well. Return as needed, if recurrence of discomfort we will consider switching to viscosupplementation if within 3 months or repeat steroid injection if 3+ months.

## 2019-03-13 NOTE — Progress Notes (Signed)
Subjective:    CC: Follow-up  HPI: This is a pleasant 73 year old female, we aspirated 65 cc of hemarthrosis and injected her knee, she returns today for the most part pain-free.  I reviewed the past medical history, family history, social history, surgical history, and allergies today and no changes were needed.  Please see the problem list section below in epic for further details.  Past Medical History: Past Medical History:  Diagnosis Date  . Alopecia   . Hypothyroidism   . IBS (irritable bowel syndrome)   . Lactose intolerance   . Mitral valve regurgitation   . Multiple sclerosis ([place])   . Raynaud's disease    Past Surgical History: Past Surgical History:  Procedure Laterality Date  . ABDOMINAL HYSTERECTOMY    . CHOLECYSTECTOMY    . KNEE SURGERY    . TOTAL HIP ARTHROPLASTY     Social History: Social History   Socioeconomic History  . Marital status: Married    Spouse name: Not on file  . Number of children: Not on file  . Years of education: Not on file  . Highest education level: Not on file  Occupational History  . Not on file  Social Needs  . Financial resource strain: Not on file  . Food insecurity    Worry: Not on file    Inability: Not on file  . Transportation needs    Medical: Not on file    Non-medical: Not on file  Tobacco Use  . Smoking status: Former Research scientist (life sciences)  . Smokeless tobacco: Never Used  Substance and Sexual Activity  . Alcohol use: No  . Drug use: No  . Sexual activity: Not Currently  Lifestyle  . Physical activity    Days per week: Not on file    Minutes per session: Not on file  . Stress: Not on file  Relationships  . Social Herbalist on phone: Not on file    Gets together: Not on file    Attends religious service: Not on file    Active member of club or organization: Not on file    Attends meetings of clubs or organizations: Not on file    Relationship status: Not on file  Other Topics Concern  . Not on file   Social History Narrative  . Not on file   Family History: Family History  Problem Relation Age of Onset  . Hypertension Mother   . Cancer Mother        Ovarian   Allergies: Allergies  Allergen Reactions  . Gabapentin   . Macrobid [employer] Macro]   . Nsaids   . Sulfa Antibiotics   . Tetracyclines & Related   . Thimerosal    Medications: See med rec.  Review of Systems: No fevers, chills, night sweats, weight loss, chest pain, or shortness of breath.   Objective:    General: Well Developed, well nourished, and in no acute distress.  Neuro: Alert and oriented x3, extra-ocular muscles intact, sensation grossly intact.  HEENT: Normocephalic, atraumatic, pupils equal round reactive to light, neck supple, no masses, no lymphadenopathy, thyroid nonpalpable.  Skin: Warm and dry, no rashes. Cardiac: Regular rate and rhythm, no murmurs rubs or gallops, no lower extremity edema.  Respiratory: Clear to auscultation bilaterally. Not using accessory muscles, speaking in full sentences.  Impression and Recommendations:    Primary osteoarthritis of right knee Tricompartmental arthritis and degenerative meniscal tearing on MRI as expected, at the last visit we did aspirated 65  cc of frank blood and injected the knee, she returns today doing well. Return as needed, if recurrence of discomfort we will consider switching to viscosupplementation if within 3 months or repeat steroid injection if 3+ months.   ___________________________________________ Ihor Austin. Benjamin Stain, M.D., ABFM., CAQSM. Primary Care and Sports Medicine [place] [facility]  Adjunct Professor of Family Medicine  [employer]

## 2019-08-06 ENCOUNTER — Other Ambulatory Visit: Payer: Self-pay

## 2019-08-06 ENCOUNTER — Other Ambulatory Visit: Payer: Self-pay | Admitting: Sports Medicine

## 2019-08-06 ENCOUNTER — Ambulatory Visit (INDEPENDENT_AMBULATORY_CARE_PROVIDER_SITE_OTHER): Payer: Medicare Other | Admitting: Sports Medicine

## 2019-08-06 ENCOUNTER — Ambulatory Visit (INDEPENDENT_AMBULATORY_CARE_PROVIDER_SITE_OTHER): Payer: Medicare Other

## 2019-08-06 DIAGNOSIS — Z96643 Presence of artificial hip joint, bilateral: Secondary | ICD-10-CM | POA: Diagnosis not present

## 2019-08-06 DIAGNOSIS — S8000XA Contusion of unspecified knee, initial encounter: Secondary | ICD-10-CM | POA: Diagnosis not present

## 2019-08-06 DIAGNOSIS — W19XXXA Unspecified fall, initial encounter: Secondary | ICD-10-CM

## 2019-08-06 DIAGNOSIS — S8001XA Contusion of right knee, initial encounter: Secondary | ICD-10-CM | POA: Diagnosis not present

## 2019-08-06 DIAGNOSIS — S8002XA Contusion of left knee, initial encounter: Secondary | ICD-10-CM | POA: Diagnosis not present

## 2019-08-06 MED ORDER — HYDROCODONE-ACETAMINOPHEN 10-325 MG PO TABS: 1.0000 | ORAL_TABLET | Freq: Three times a day (TID) | ORAL | 0 refills | Status: DC | PRN

## 2019-08-06 NOTE — Assessment & Plan Note (Signed)
This pleasant 74 year old female with a history of multiple sclerosis had an accidental fall. She impacted her left worse than right knees, she was able to bear weight afterwards, but developed significant swelling and pain. On exam she is tender, she is bruised, she is swollen but she has good motion and good strength. Adding high-dose Vicodin, x-rays, activity modification, elevate extremities, ice 20 minutes 3-4 times a day, I like to see her back in 1 to 2 weeks and we can do an injection if no better.

## 2019-08-06 NOTE — Progress Notes (Signed)
    Procedures performed today:    None.  Independent interpretation of notes and tests performed by another provider:   None.  Brief History, Exam, Impression, and Recommendations:    Contusion of knees This pleasant 74 year old female with a history of multiple sclerosis had an accidental fall. She impacted her left worse than right knees, she was able to bear weight afterwards, but developed significant swelling and pain. On exam she is tender, she is bruised, she is swollen but she has good motion and good strength. Adding high-dose Vicodin, x-rays, activity modification, elevate extremities, ice 20 minutes 3-4 times a day, I like to see her back in 1 to 2 weeks and we can do an injection if no better.    ___________________________________________ Ihor Austin. Benjamin Stain, M.D., ABFM., CAQSM. Primary Care and Sports Medicine [place] [facility]  Adjunct Instructor of Family Medicine  [employer]

## 2019-08-20 ENCOUNTER — Encounter: Payer: Self-pay | Admitting: Sports Medicine

## 2019-08-20 ENCOUNTER — Other Ambulatory Visit: Payer: Self-pay

## 2019-08-20 ENCOUNTER — Ambulatory Visit (INDEPENDENT_AMBULATORY_CARE_PROVIDER_SITE_OTHER): Payer: Medicare Other | Admitting: Sports Medicine

## 2019-08-20 DIAGNOSIS — Z96642 Presence of left artificial hip joint: Secondary | ICD-10-CM | POA: Diagnosis not present

## 2019-08-20 DIAGNOSIS — S8002XD Contusion of left knee, subsequent encounter: Secondary | ICD-10-CM

## 2019-08-20 NOTE — Assessment & Plan Note (Signed)
Anna Carpenter returns, she fell and had some knee contusions, these are doing well and continuing to improve. X-rays did not show any evidence of fracture. She still has some pain on the left around the pes bursa, but would like to let this heal on its own for at least another month.

## 2019-08-20 NOTE — Progress Notes (Addendum)
    Procedures performed today:    None.  Independent interpretation of notes and tests performed by another provider:   None.  Brief History, Exam, Impression, and Recommendations:    Contusion of knees Anna Carpenter returns, she fell and had some knee contusions, these are doing well and continuing to improve. X-rays did not show any evidence of fracture. She still has some pain on the left around the pes bursa, but would like to let this heal on its own for at least another month.  History of total left hip arthroplasty I wished [name] also had pain in her left groin after the fall, but on further questioning she reports moderate to severe pain since her arthroplasty approximately 9 years ago. She has had multiple x-rays with her surgeon, physical therapy, but no answer, she has become depressed with her left anterior hip pain, without an explanation. She has pain with internal rotation and pain with resisted flexion as well as weakness. She did have an anterior approach arthroplasty. Because of consistent negative x-rays we are going to proceed with MRI as well as a three-phase bone scan looking for loosening of the arthroplasty components.  There is definite severe degenerative disc disease with height loss at L4-L5 and L5-S1 as well as severe lumbar facet arthritis all of which can cause pain radiating around to the anterior hip.  Before we address this in detail which may include a lumbar spine MRI we are going to await the bone scan looking for loosening of the hip prosthesis.  Update, patient does not desire to do the bone scan.  I wished her luck, she can see me as needed for this.    ___________________________________________ Ihor Austin. Benjamin Stain, M.D., ABFM., CAQSM. Primary Care and Sports Medicine [place] [facility]  Adjunct Instructor of Family Medicine  [employer]

## 2019-08-20 NOTE — Assessment & Plan Note (Addendum)
I wished [name] also had pain in her left groin after the fall, but on further questioning she reports moderate to severe pain since her arthroplasty approximately 9 years ago. She has had multiple x-rays with her surgeon, physical therapy, but no answer, she has become depressed with her left anterior hip pain, without an explanation. She has pain with internal rotation and pain with resisted flexion as well as weakness. She did have an anterior approach arthroplasty. Because of consistent negative x-rays we are going to proceed with MRI as well as a three-phase bone scan looking for loosening of the arthroplasty components.  There is definite severe degenerative disc disease with height loss at L4-L5 and L5-S1 as well as severe lumbar facet arthritis all of which can cause pain radiating around to the anterior hip.  Before we address this in detail which may include a lumbar spine MRI we are going to await the bone scan looking for loosening of the hip prosthesis.  Update, patient does not desire to do the bone scan.  I wished her luck, she can see me as needed for this.

## 2019-08-23 ENCOUNTER — Other Ambulatory Visit: Payer: Self-pay

## 2019-08-23 ENCOUNTER — Ambulatory Visit (INDEPENDENT_AMBULATORY_CARE_PROVIDER_SITE_OTHER): Payer: Medicare Other

## 2019-08-23 DIAGNOSIS — R1030 Lower abdominal pain, unspecified: Secondary | ICD-10-CM | POA: Diagnosis not present

## 2019-08-23 DIAGNOSIS — Z96643 Presence of artificial hip joint, bilateral: Secondary | ICD-10-CM | POA: Diagnosis not present

## 2019-08-31 ENCOUNTER — Encounter (HOSPITAL_COMMUNITY): Payer: Medicare Other

## 2019-10-30 IMAGING — DX DG SHOULDER 2+V*R*
4 series · 4 of 4 positions shown · non-contrast
Comparison: None.

CLINICAL DATA: Right shoulder pain radiating to the lower neck.

EXAM:
RIGHT SHOULDER - 2+ VIEW

[shoulder grashey]
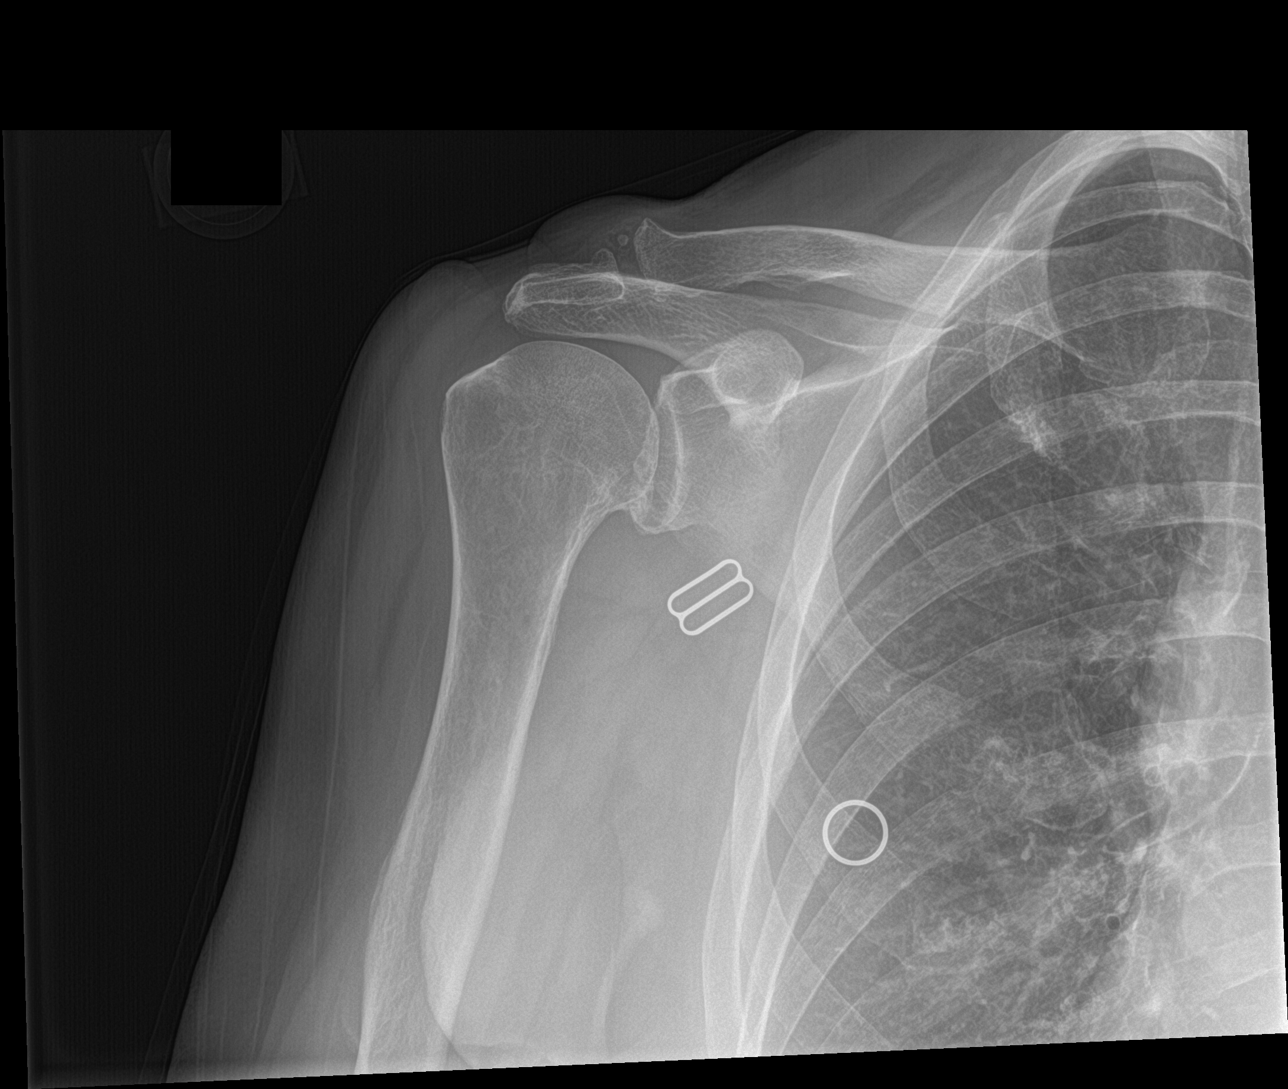

[shoulder y view (1 of 2)]
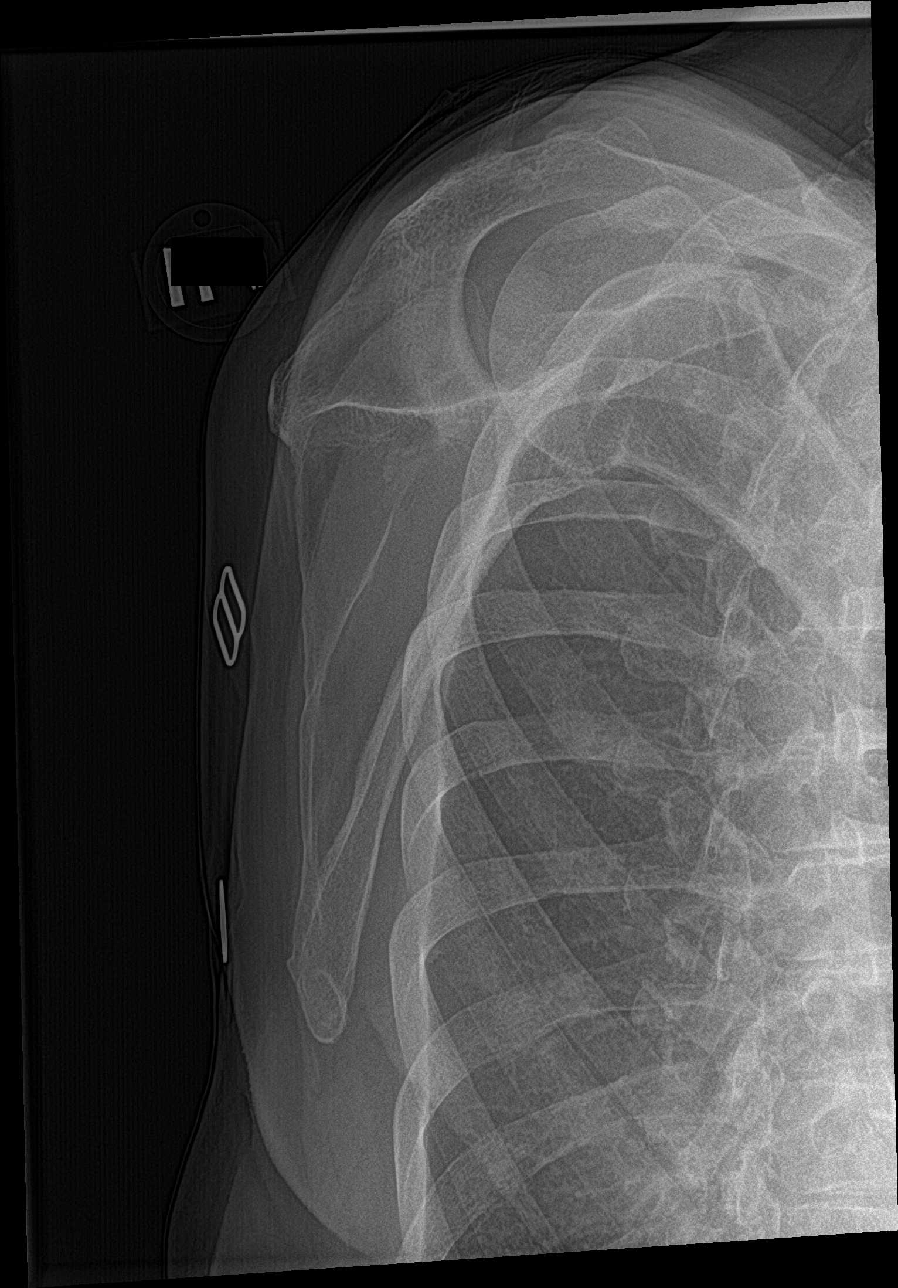

[shoulder axillary]
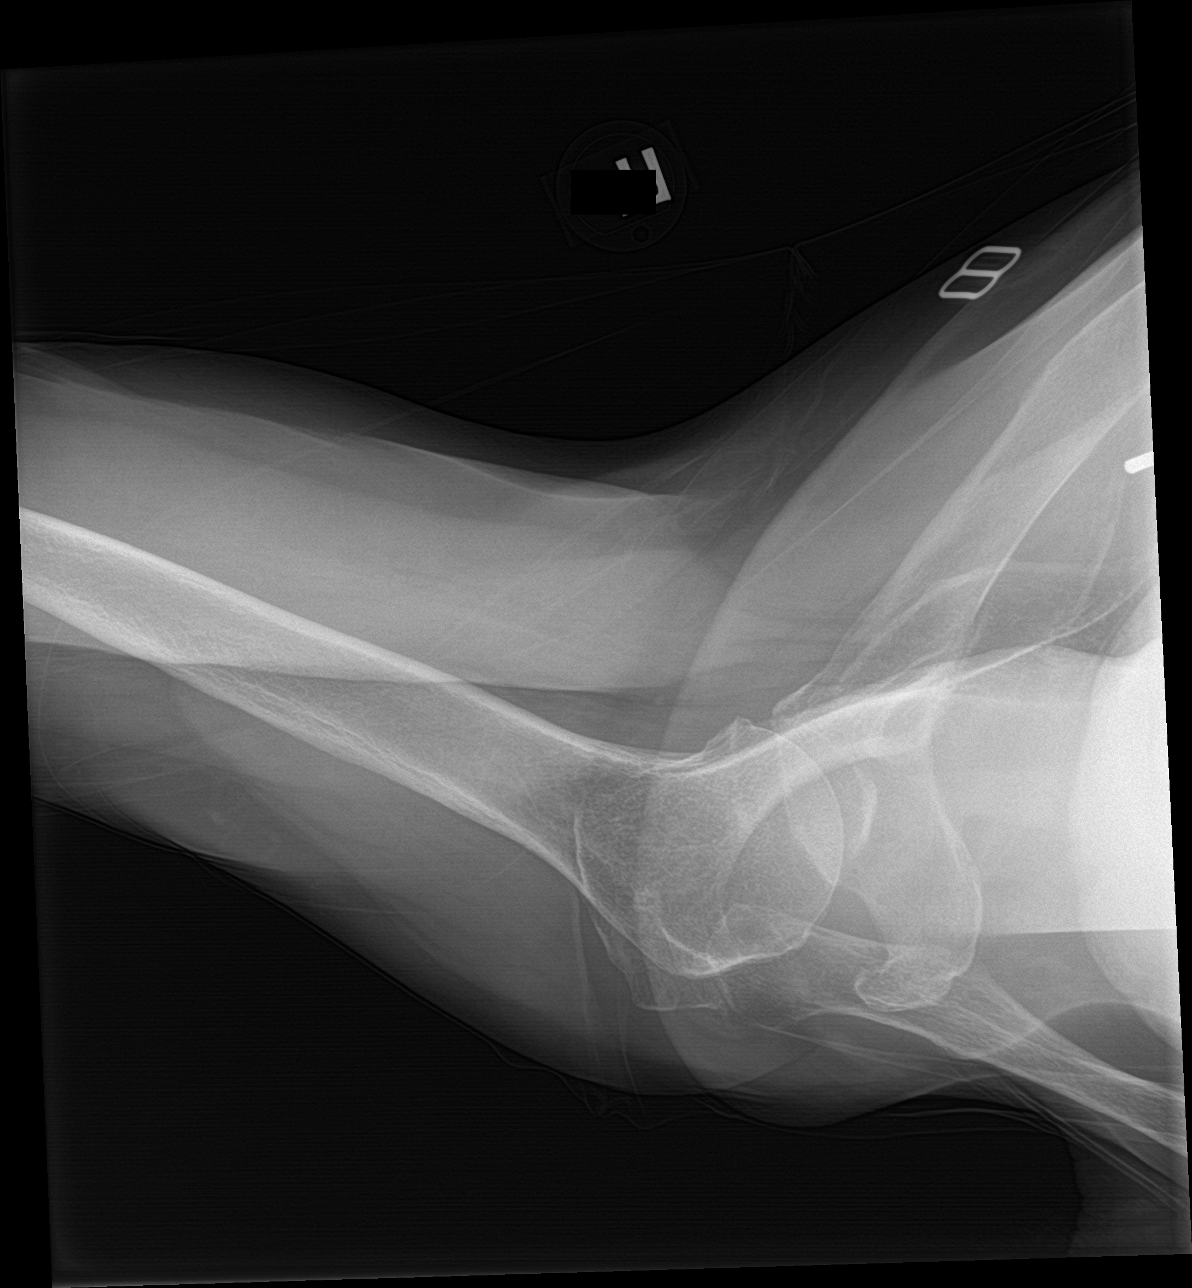

[shoulder y view (2 of 2)]
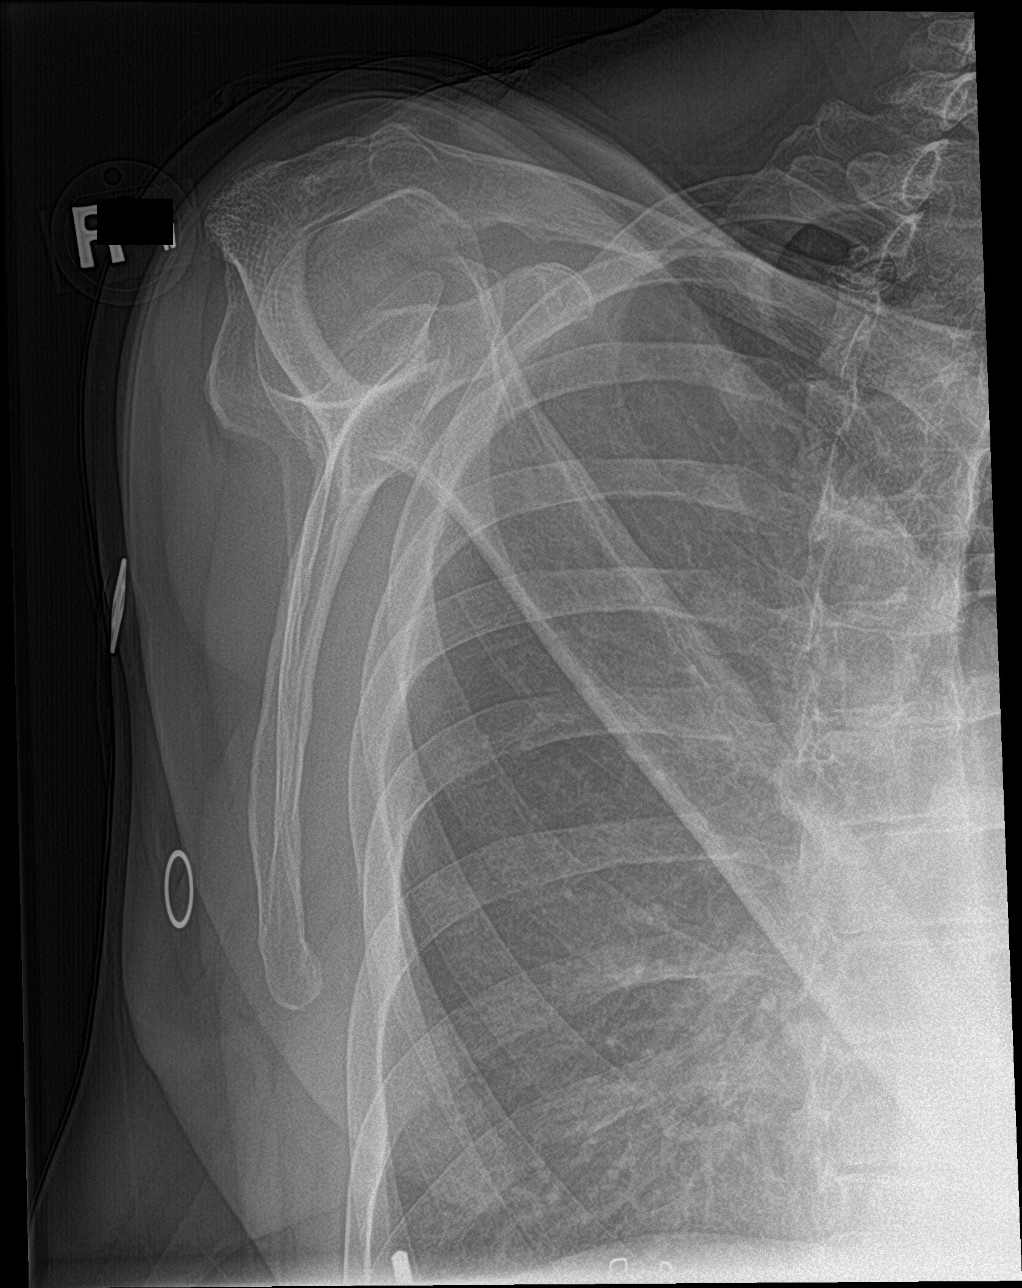

[4 of 4 positions shown; findings below may reference images not displayed]

FINDINGS: There is no evidence of fracture or dislocation. Mild osteoarthritic
changes of the glenohumeral and acromioclavicular joints. Soft
tissues are unremarkable.
IMPRESSION: No acute fracture or dislocation identified about the right
shoulder.

Mild osteoarthritic changes of the glenohumeral and
acromioclavicular joints.

## 2020-07-19 ENCOUNTER — Ambulatory Visit (INDEPENDENT_AMBULATORY_CARE_PROVIDER_SITE_OTHER): Payer: Medicare Other

## 2020-07-19 ENCOUNTER — Ambulatory Visit (INDEPENDENT_AMBULATORY_CARE_PROVIDER_SITE_OTHER): Payer: Medicare Other | Admitting: Sports Medicine

## 2020-07-19 ENCOUNTER — Other Ambulatory Visit: Payer: Self-pay

## 2020-07-19 DIAGNOSIS — M25512 Pain in left shoulder: Secondary | ICD-10-CM

## 2020-07-19 MED ORDER — MELOXICAM 15 MG PO TABS: ORAL_TABLET | ORAL | 3 refills | Status: DC

## 2020-07-19 NOTE — Assessment & Plan Note (Addendum)
A couple weeks ago this pleasant 75 year old female was trying to put on her jacket, when she went to internally rotate and extend her left shoulder to put the arm in the sleeve she felt the pain.  Unfortunately discontinued over the last couple of weeks. On exam she has glenohumeral signs, only minimal bicipital and impingement type signs. We will start conservatively with x-rays, rehabilitation exercises and meloxicam. She has NSAIDs listed as an allergy, she does fine with aspirin, and she has taken ibuprofen without problem, she did have some hives with naproxen. Return to see me in 4 to 6 weeks, we will do an injection into the glenohumeral joint if not better.

## 2020-07-19 NOTE — Progress Notes (Signed)
    Procedures performed today:    None.  Independent interpretation of notes and tests performed by another provider:   None.  Brief History, Exam, Impression, and Recommendations:    Acute pain of left shoulder A couple weeks ago this pleasant 75 year old female was trying to put on her jacket, when she went to internally rotate and extend her left shoulder to put the arm in the sleeve she felt the pain.  Unfortunately discontinued over the last couple of weeks. On exam she has glenohumeral signs, only minimal bicipital and impingement type signs. We will start conservatively with x-rays, rehabilitation exercises and meloxicam. She has NSAIDs listed as an allergy, she does fine with aspirin, and she has taken ibuprofen without problem, she did have some hives with naproxen. Return to see me in 4 to 6 weeks, we will do an injection into the glenohumeral joint if not better.    ___________________________________________ Ihor Austin. Benjamin Stain, M.D., ABFM., CAQSM. Primary Care and Sports Medicine [place] [facility]  Adjunct Instructor of Family Medicine  [employer] [facility]

## 2020-07-21 MED ORDER — IBUPROFEN 800 MG PO TABS: 800.0000 mg | ORAL_TABLET | Freq: Three times a day (TID) | ORAL | 2 refills | Status: DC | PRN

## 2020-08-16 ENCOUNTER — Ambulatory Visit: Payer: Medicare Other | Admitting: Sports Medicine

## 2020-09-22 ENCOUNTER — Ambulatory Visit: Payer: Medicare Other | Admitting: Sports Medicine

## 2021-06-16 ENCOUNTER — Ambulatory Visit (INDEPENDENT_AMBULATORY_CARE_PROVIDER_SITE_OTHER): Payer: Medicare Other

## 2021-06-16 ENCOUNTER — Ambulatory Visit (INDEPENDENT_AMBULATORY_CARE_PROVIDER_SITE_OTHER): Payer: Medicare Other | Admitting: Sports Medicine

## 2021-06-16 ENCOUNTER — Other Ambulatory Visit: Payer: Self-pay

## 2021-06-16 DIAGNOSIS — M1711 Unilateral primary osteoarthritis, right knee: Secondary | ICD-10-CM

## 2021-06-16 NOTE — Assessment & Plan Note (Signed)
This is a pleasant 76 year old female, she has known tricompartmental osteoarthritis and degenerative meniscal tearing we have seen on an MRI previously. ?We injected her knee back in November 2020, she did well until recently. ?Unfortunately had a twist and a fall while doing martial arts class. ?Pain is at the medial joint line with mild swelling. ?Today we injected the knee, I would like updated x-rays. ?Unable to use NSAIDs. ?Return to see me in 6 weeks, MRI for surgical planning if not better ?

## 2021-06-16 NOTE — Progress Notes (Signed)
? ? ?  Procedures performed today:   ? ?Procedure: Real-time Ultrasound Guided injection of the right knee ?Device: Samsung HS60  ?Verbal informed consent obtained.  ?Time-out conducted.  ?Noted no overlying erythema, induration, or other signs of local infection.  ?Skin prepped in a sterile fashion.  ?Local anesthesia: Topical Ethyl chloride.  ?With sterile technique and under real time ultrasound guidance: Noted mild effusion, 1 cc Kenalog 40, 2 cc lidocaine, 2 cc bupivacaine injected easily ?Completed without difficulty  ?Advised to call if fevers/chills, erythema, induration, drainage, or persistent bleeding.  ?Images permanently stored and available for review in PACS.  ?Impression: Technically successful ultrasound guided injection. ? ?Independent interpretation of notes and tests performed by another provider:  ? ?None. ? ?Brief History, Exam, Impression, and Recommendations:   ? ?Primary osteoarthritis of right knee ?This is a pleasant 76 year old female, she has known tricompartmental osteoarthritis and degenerative meniscal tearing we have seen on an MRI previously. ?We injected her knee back in November 2020, she did well until recently. ?Unfortunately had a twist and a fall while doing martial arts class. ?Pain is at the medial joint line with mild swelling. ?Today we injected the knee, I would like updated x-rays. ?Unable to use NSAIDs. ?Return to see me in 6 weeks, MRI for surgical planning if not better ? ? ? ?___________________________________________ ?Ihor Austin. Benjamin Stain, M.D., ABFM., CAQSM. ?Primary Care and Sports Medicine ?[facility] [place] [facility] ? ?Adjunct Instructor of Family Medicine  ?[employer] ?

## 2021-06-19 ENCOUNTER — Encounter: Payer: Self-pay | Admitting: Sports Medicine

## 2021-07-21 ENCOUNTER — Ambulatory Visit (INDEPENDENT_AMBULATORY_CARE_PROVIDER_SITE_OTHER): Payer: Medicare Other

## 2021-07-21 DIAGNOSIS — Z09 Encounter for follow-up examination after completed treatment for conditions other than malignant neoplasm: Secondary | ICD-10-CM | POA: Diagnosis not present

## 2021-07-21 DIAGNOSIS — M1711 Unilateral primary osteoarthritis, right knee: Secondary | ICD-10-CM

## 2021-07-28 ENCOUNTER — Ambulatory Visit (INDEPENDENT_AMBULATORY_CARE_PROVIDER_SITE_OTHER): Payer: Medicare Other | Admitting: Sports Medicine

## 2021-07-28 DIAGNOSIS — M1711 Unilateral primary osteoarthritis, right knee: Secondary | ICD-10-CM | POA: Diagnosis not present

## 2021-07-28 NOTE — Assessment & Plan Note (Signed)
Pleasant 76 year old female, tricompartmental osteoarthritis on x-rays, also degenerative meniscal tearing seen previous on an MRI, last injection was November 2020, she was doing a martial arts class and did a twist and had some increasing pain, at the last visit about 6 weeks ago we did a knee joint injection and she returns today for the most part pain-free. ?Her only discomfort is going up and down stairs, I have advised her to continue to work aggressively on quadriceps strengthening which will help, she can certainly try some icing. ?She is pill averse. ?Bracing works for a few hours and then causes worsening pain so she will use this as needed. ?Return as needed. ?Next step would be [facility]. ?

## 2021-07-28 NOTE — Progress Notes (Signed)
? ? ?  Procedures performed today:   ? ?None. ? ?Independent interpretation of notes and tests performed by another provider:  ? ?None. ? ?Brief History, Exam, Impression, and Recommendations:   ? ?Primary osteoarthritis of right knee ?Pleasant 76 year old female, tricompartmental osteoarthritis on x-rays, also degenerative meniscal tearing seen previous on an MRI, last injection was November 2020, she was doing a martial arts class and did a twist and had some increasing pain, at the last visit about 6 weeks ago we did a knee joint injection and she returns today for the most part pain-free. ?Her only discomfort is going up and down stairs, I have advised her to continue to work aggressively on quadriceps strengthening which will help, she can certainly try some icing. ?She is pill averse. ?Bracing works for a few hours and then causes worsening pain so she will use this as needed. ?Return as needed. ?Next step would be [facility]. ? ? ? ?___________________________________________ ?Gwen Her. Dianah Field, M.D., ABFM., CAQSM. ?Primary Care and Sports Medicine ?[place] ? ?Adjunct Instructor of Family Medicine  ?[employer] ?

## 2021-08-18 IMAGING — DX DG HIP (WITH OR WITHOUT PELVIS) 2-3V*L*
3 series · 3 of 3 positions shown · non-contrast
Comparison: None.

CLINICAL DATA: Fall

EXAM:
DG HIP (WITH OR WITHOUT PELVIS) 2-3V LEFT

[pelvis ap]
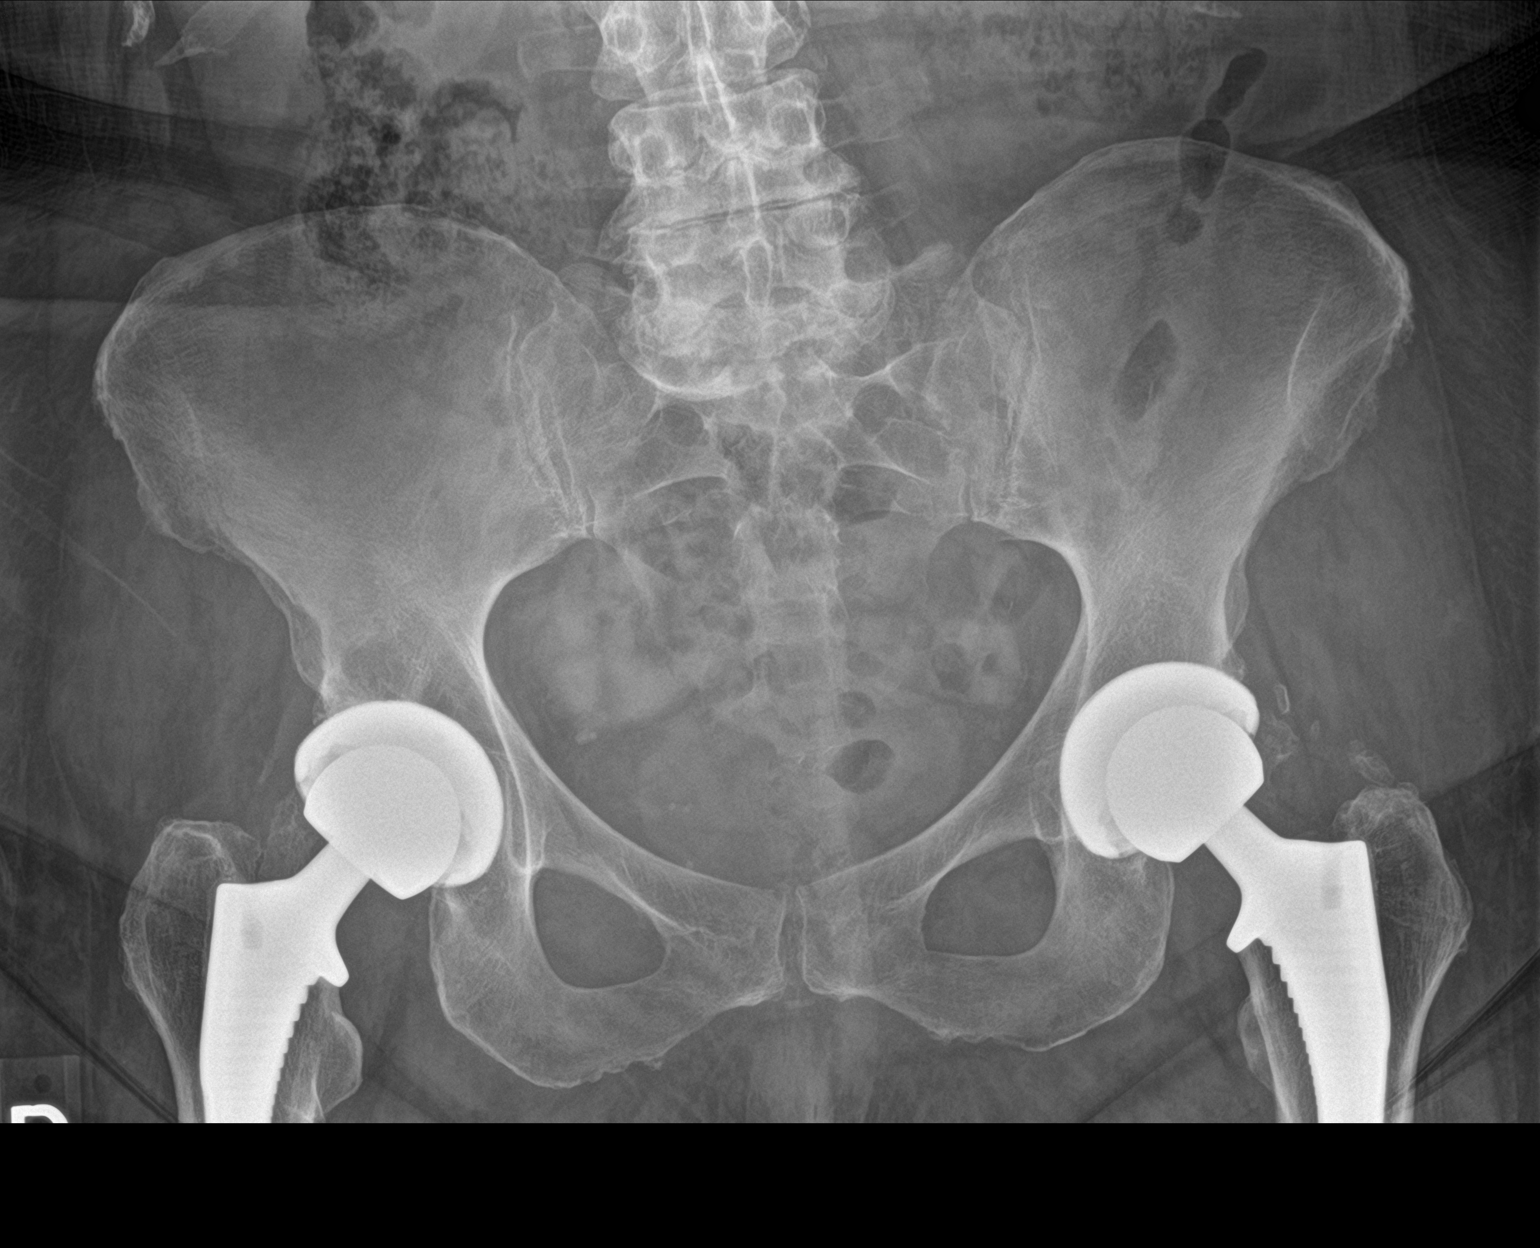

[hip ap]
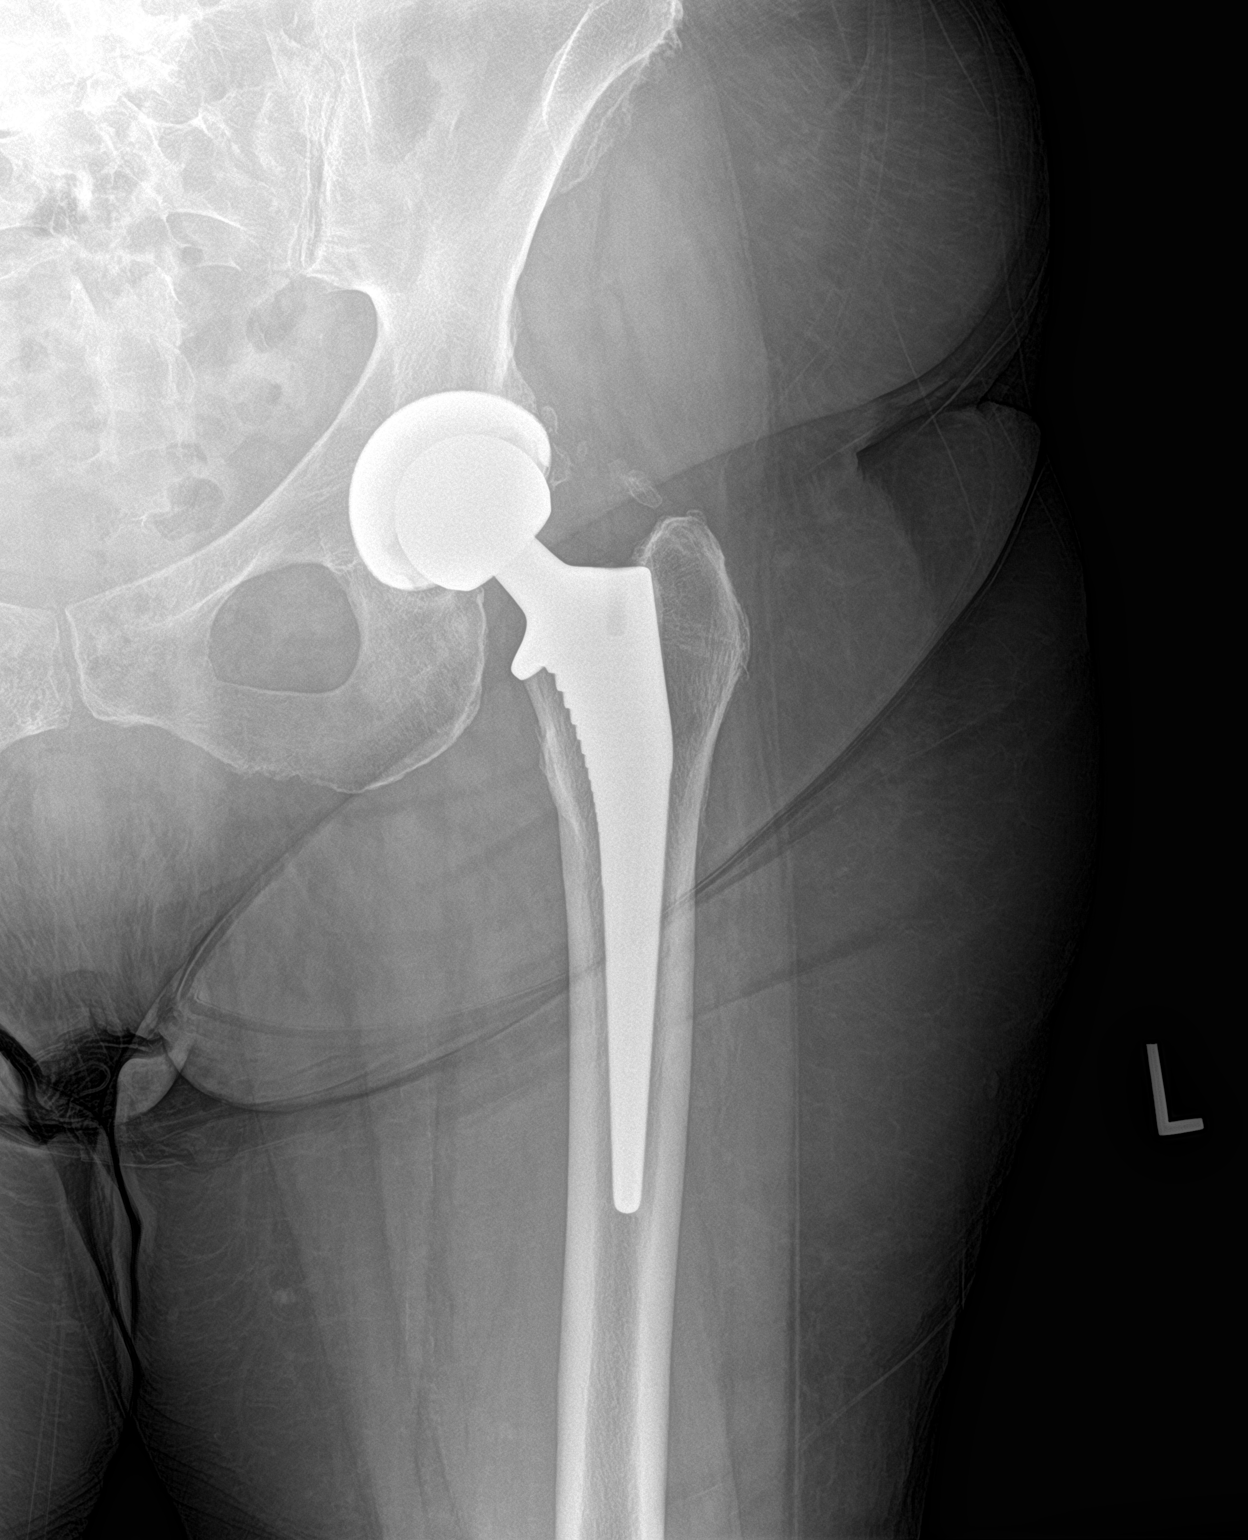

[hip lat]
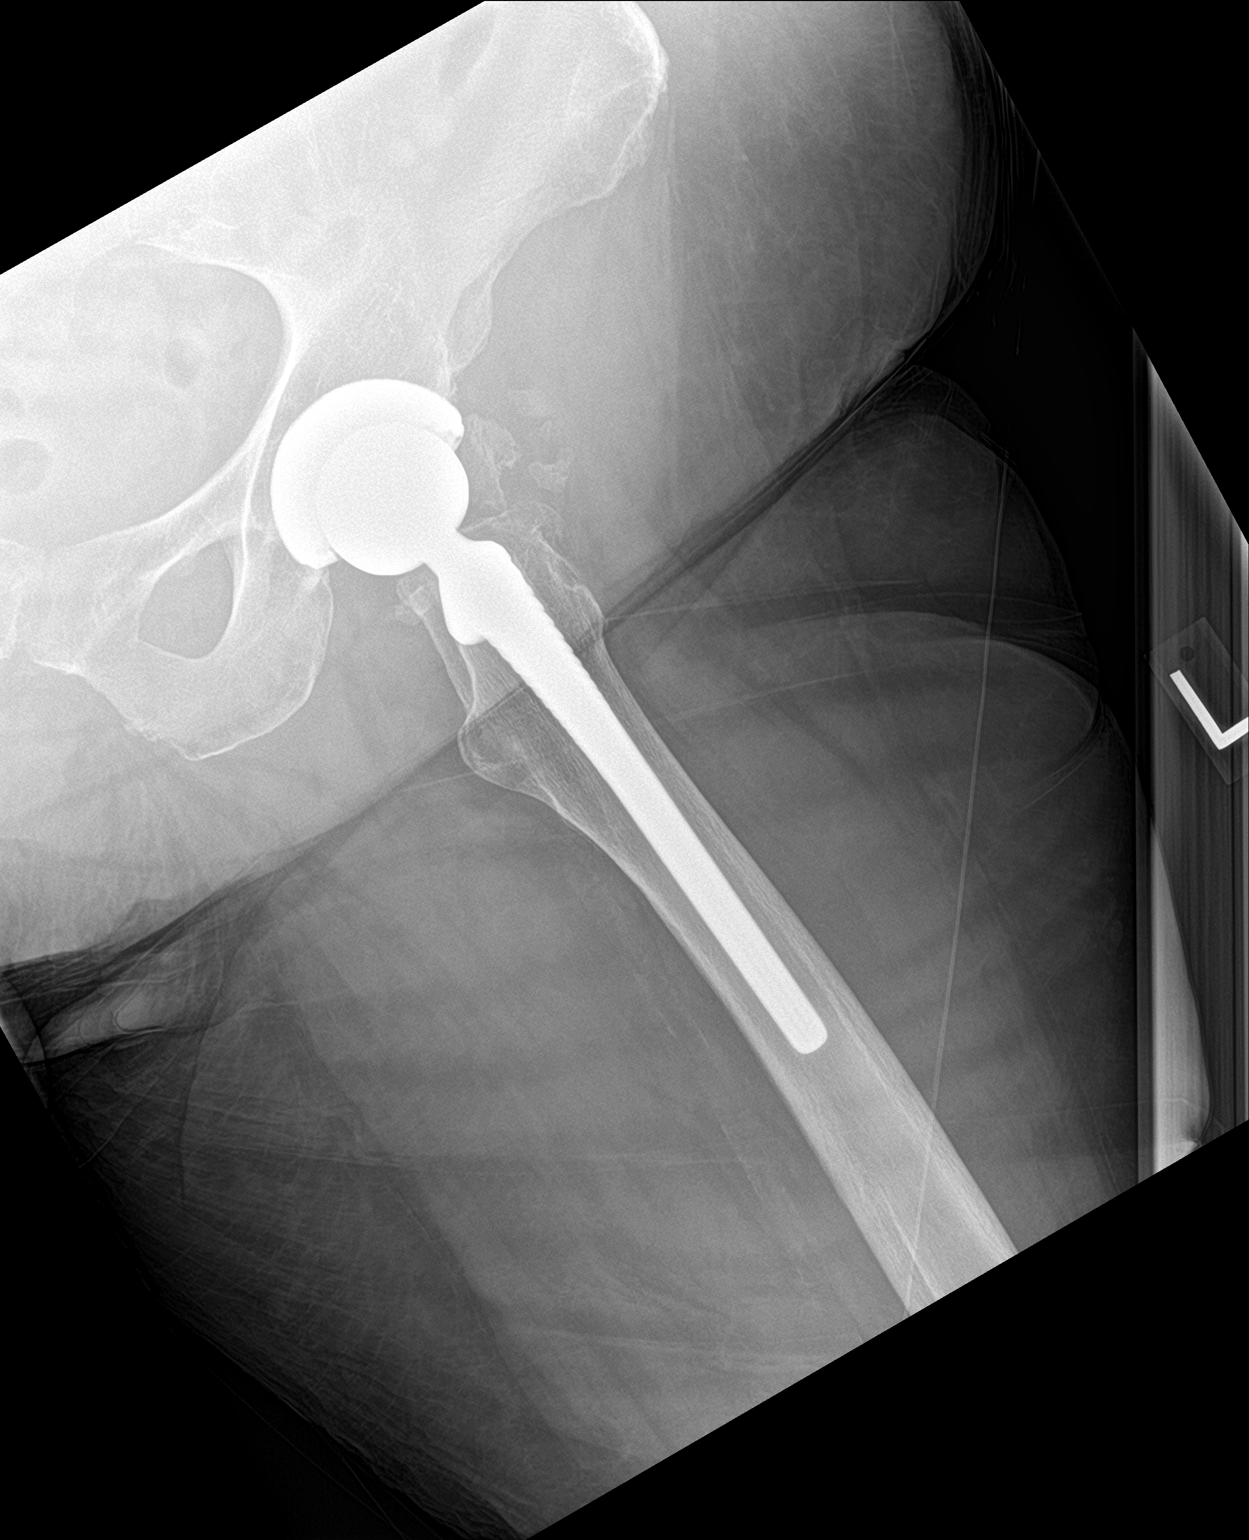

[3 of 3 positions shown; findings below may reference images not displayed]

FINDINGS: Signs of bilateral total hip arthroplasty. No sign of pelvic
fracture. Lumbar spine degenerative changes.

LEFT hip without evidence of periprosthetic fracture. Prosthetic
femoral head within the acetabulum.
IMPRESSION: Bilateral total hip arthroplasty. No sign of periprosthetic fracture
about the LEFT proximal femur and hip.

## 2021-10-18 ENCOUNTER — Ambulatory Visit (INDEPENDENT_AMBULATORY_CARE_PROVIDER_SITE_OTHER): Payer: Medicare Other

## 2021-10-18 ENCOUNTER — Ambulatory Visit: Payer: Medicare Other | Admitting: Sports Medicine

## 2021-10-18 DIAGNOSIS — M170 Bilateral primary osteoarthritis of knee: Secondary | ICD-10-CM

## 2021-10-18 MED ORDER — ACETAMINOPHEN ER 650 MG PO TBCR: 650.0000 mg | EXTENDED_RELEASE_TABLET | Freq: Three times a day (TID) | ORAL | 3 refills | Status: AC | PRN

## 2021-10-18 NOTE — Progress Notes (Signed)
    Procedures performed today:    None.  Independent interpretation of notes and tests performed by another provider:   None.  Brief History, Exam, Impression, and Recommendations:    Primary osteoarthritis of both knees This is a very pleasant 76 year old female, she has bilateral knee osteoarthritis, we have treated her longitudinally, she has had an injection in the right knee back in March that she tells me did not provide much relief. Right knee does feel okay for now. She is not using any analgesics, she tells me she is allergic to NSAIDs, but not to Tylenol. Now she is having pain left knee after twisting while dancing. Pain medial joint line and retropatellar. Adding updated x-rays, she will do arthritis strength Tylenol, home conditioning, return to see me in 6 weeks, injection if not better. If all of the above fails we will proceed with [facility].    ____________________________________________ Ihor Austin. Benjamin Stain, M.D., ABFM., CAQSM., AME. Primary Care and Sports Medicine [place] [facility]  Adjunct Professor of Family Medicine  [employer]  Restaurant manager, fast food

## 2021-10-18 NOTE — Assessment & Plan Note (Signed)
This is a very pleasant 76 year old female, she has bilateral knee osteoarthritis, we have treated her longitudinally, she has had an injection in the right knee back in March that she tells me did not provide much relief. Right knee does feel okay for now. She is not using any analgesics, she tells me she is allergic to NSAIDs, but not to Tylenol. Now she is having pain left knee after twisting while dancing. Pain medial joint line and retropatellar. Adding updated x-rays, she will do arthritis strength Tylenol, home conditioning, return to see me in 6 weeks, injection if not better. If all of the above fails we will proceed with [facility].

## 2021-11-02 ENCOUNTER — Ambulatory Visit (INDEPENDENT_AMBULATORY_CARE_PROVIDER_SITE_OTHER): Payer: Medicare Other

## 2021-11-02 ENCOUNTER — Telehealth: Payer: Self-pay | Admitting: Sports Medicine

## 2021-11-02 ENCOUNTER — Ambulatory Visit (INDEPENDENT_AMBULATORY_CARE_PROVIDER_SITE_OTHER): Payer: Medicare Other | Admitting: Sports Medicine

## 2021-11-02 DIAGNOSIS — M170 Bilateral primary osteoarthritis of knee: Secondary | ICD-10-CM

## 2021-11-02 NOTE — Telephone Encounter (Signed)
[facility] paperwork faxed to [facility] at [phone] Request is for [facility] Pt's insurance prefers [facility] Fax confirmation receipt received  

## 2021-11-02 NOTE — Telephone Encounter (Signed)
[facility] approval please, bilateral, x-ray confirmed osteoarthritis that failed injections, analgesics, greater than 6 weeks of therapy.

## 2021-11-02 NOTE — Progress Notes (Signed)
    Procedures performed today:    Procedure: Real-time Ultrasound Guided aspiration/injection of left knee Device: Samsung HS60  Verbal informed consent obtained.  Time-out conducted.  Noted no overlying erythema, induration, or other signs of local infection.  Skin prepped in a sterile fashion.  Local anesthesia: Topical Ethyl chloride.  With sterile technique and under real time ultrasound guidance: Aspirated 30 mL of clear, straw-colored fluid, syringe switched and 1 cc Kenalog 40, 2 cc lidocaine, 2 cc bupivacaine injected easily Completed without difficulty  Advised to call if fevers/chills, erythema, induration, drainage, or persistent bleeding.  Images permanently stored and available for review in PACS.  Impression: Technically successful ultrasound guided aspiration/injection.  Independent interpretation of notes and tests performed by another provider:   None.  Brief History, Exam, Impression, and Recommendations:    Primary osteoarthritis of both knees Pleasant 76 year old female, bilateral patellofemoral osteoarthritis, she is post patella fracture on the left with ORIF. Right knee injection back in March did not provide significant relief, she was having increasing pain left knee after twisting while dancing, allergic to NSAIDs, Tylenol has also not been effective and for some reason she has developed an intolerance. I doubt she has any true allergy. She has an effusion, today we drained and injected her knee, we will also get her approved for viscosupplementation.    ____________________________________________ Ihor Austin. Benjamin Stain, M.D., ABFM., CAQSM., AME. Primary Care and Sports Medicine [place] [facility]  Adjunct Professor of Family Medicine  [place] of [facility] [employer]  Restaurant manager, fast food

## 2021-11-02 NOTE — Assessment & Plan Note (Signed)
Pleasant 76 year old female, bilateral patellofemoral osteoarthritis, she is post patella fracture on the left with ORIF. Right knee injection back in March did not provide significant relief, she was having increasing pain left knee after twisting while dancing, allergic to NSAIDs, Tylenol has also not been effective and for some reason she has developed an intolerance. I doubt she has any true allergy. She has an effusion, today we drained and injected her knee, we will also get her approved for viscosupplementation.

## 2021-11-08 NOTE — Telephone Encounter (Signed)
Benefits Investigation Details received from [employer] Injection: Synvisc  Medical: Deductible does not apply. Once the OOP has been met, pt iis covered at 100%. Only one copay applies per date of service.  PA required: No Per UHC, no PA is required for Synvisc. Ref# [id number]  Pharmacy: Product is not covered under the pharmacy plan.   Specialty Pharmacy: [facility]  May fill through: [facility] Copay/Coinsurance:  Product Copay: 20% ($280) Administration Coinsurance:  Administration Copay: $20 Deductible:  Out of Pocket Max: $3600 (met: $90.91)  remaining: $3509.09

## 2021-11-23 NOTE — Telephone Encounter (Signed)
Patient's approx cost is $300 per injection.   Left VM for a erturn call regarding cost and getting started on the gel injections.

## 2021-11-28 NOTE — Telephone Encounter (Signed)
Patient aware of her OOP cost per injection. She states that the pain is okay right now and she will hold off on proceeding with gel injections. She is aware that if she meets her OOP max, the injections will be covered at 100%. She is also aware that steroid injections can be done every 3 months if needed for pain.

## 2021-12-01 ENCOUNTER — Ambulatory Visit: Payer: Medicare Other | Admitting: Sports Medicine

## 2022-04-12 ENCOUNTER — Ambulatory Visit (INDEPENDENT_AMBULATORY_CARE_PROVIDER_SITE_OTHER): Payer: Medicare Other | Admitting: Sports Medicine

## 2022-04-12 ENCOUNTER — Ambulatory Visit (INDEPENDENT_AMBULATORY_CARE_PROVIDER_SITE_OTHER): Payer: Medicare Other

## 2022-04-12 DIAGNOSIS — M170 Bilateral primary osteoarthritis of knee: Secondary | ICD-10-CM

## 2022-04-12 MED ORDER — TRIAMCINOLONE ACETONIDE 40 MG/ML IJ SUSP
40.0000 mg | Freq: Once | INTRAMUSCULAR | Status: AC
  Administered 2022-04-12: 40 mg via INTRAMUSCULAR

## 2022-04-12 NOTE — Progress Notes (Addendum)
    Procedures performed today:    Procedure: Real-time Ultrasound Guided aspiration/injection of left knee Device: Samsung HS60  Verbal informed consent obtained.  Time-out conducted.  Noted no overlying erythema, induration, or other signs of local infection.  Skin prepped in a sterile fashion.  Local anesthesia: Topical Ethyl chloride.  With sterile technique and under real time ultrasound guidance: Aspirated 38 mL of clear, straw-colored fluid, syringe switched and 1 cc Kenalog 40, 2 cc lidocaine, 2 cc bupivacaine injected easily Completed without difficulty  Advised to call if fevers/chills, erythema, induration, drainage, or persistent bleeding.  Images permanently stored and available for review in PACS.  Impression: Technically successful ultrasound guided aspiration/injection.  Independent interpretation of notes and tests performed by another provider:   None.  Brief History, Exam, Impression, and Recommendations:    Primary osteoarthritis of both knees This is a very pleasant 77 year old female, bilateral patellofemoral osteoarthritis, she is post patella fracture on the left status post ORIF, we have been treating her left knee, last aspiration and injection was in July of last year, recurrence of pain so we did a repeat aspiration and injection. Her approved for Orthovisc is good until the end of July 2024 so she desires to start Orthovisc we would need to start the series by mid to early June, I am happy to do bilateral if she desires.  Chronic process with exacerbation and pharmacologic intervention  ____________________________________________ Gwen Her. Dianah Field, M.D., ABFM., CAQSM., AME. Primary Care and Sports Medicine [place] [facility]  Adjunct Professor of [place] of [facility] of [employer]  Risk manager

## 2022-04-12 NOTE — Assessment & Plan Note (Signed)
This is a very pleasant 77 year old female, bilateral patellofemoral osteoarthritis, she is post patella fracture on the left status post ORIF, we have been treating her left knee, last aspiration and injection was in July of last year, recurrence of pain so we did a repeat aspiration and injection. Her approved for Orthovisc is good until the end of July 2024 so she desires to start Orthovisc we would need to start the series by mid to early June, I am happy to do bilateral if she desires.

## 2022-07-11 ENCOUNTER — Ambulatory Visit (INDEPENDENT_AMBULATORY_CARE_PROVIDER_SITE_OTHER): Payer: Medicare Other

## 2022-07-11 ENCOUNTER — Ambulatory Visit: Payer: Medicare Other | Admitting: Sports Medicine

## 2022-07-11 ENCOUNTER — Encounter: Payer: Self-pay | Admitting: Sports Medicine

## 2022-07-11 DIAGNOSIS — M7612 Psoas tendinitis, left hip: Secondary | ICD-10-CM

## 2022-07-11 DIAGNOSIS — M170 Bilateral primary osteoarthritis of knee: Secondary | ICD-10-CM

## 2022-07-11 DIAGNOSIS — Z96642 Presence of left artificial hip joint: Secondary | ICD-10-CM

## 2022-07-11 DIAGNOSIS — M549 Dorsalgia, unspecified: Secondary | ICD-10-CM

## 2022-07-11 NOTE — Progress Notes (Signed)
Procedures performed today:    Procedure: Real-time Ultrasound Guided aspiration/injection of left knee Device: Samsung HS60  Verbal informed consent obtained.  Time-out conducted.  Noted no overlying erythema, induration, or other signs of local infection.  Skin prepped in a sterile fashion.  Local anesthesia: Topical Ethyl chloride.  With sterile technique and under real time ultrasound guidance: Noted marked effusion, I advanced an 18-gauge needle into the suprapatellar recess, aspirated 40 mL of clear, straw-colored fluid, syringe switched and 1 cc kenalog 40, 2 cc lidocaine, 2 cc bupivacaine injected, syringe again switched and 30 mg/2 mL of OrthoVisc (sodium hyaluronate) in a prefilled syringe was injected easily into the knee. Completed without difficulty  Advised to call if fevers/chills, erythema, induration, drainage, or persistent bleeding.  Images permanently stored and available for review in PACS.  Impression: Technically successful ultrasound guided aspiration/injection.  Independent interpretation of notes and tests performed by another provider:   I personally reviewed a hip MRI from 2021, I do notice significant fatty atrophy and potential tearing of the psoas major.  Left side.  Brief History, Exam, Impression, and Recommendations:    Primary osteoarthritis of both knees Very pleasant 77 year old female, bilateral patellofemoral osteoarthritis, status post patella fracture on the left, ORIF, last aspiration injection left knee was early January 2024, now with recurrence of swelling and pain, she did get approval for Orthovisc that in this July 2024, we will also start Orthovisc No. 1 today, return to see me in 1 week for Orthovisc No. 2 of 4.  History of total left hip arthroplasty Anna Carpenter returns, she is status post left hip arthroplasty. Since then she has had persistent pain particularly with flexion of the hip, she has significant weakness, discomfort, overall  good passive motion. She has had months of physical therapy without improvement, she did have some x-rays with Carpenter surgeon, all without an answer. We ordered an MRI back in 2021 that was read as normal.  She continues to have significant pain in the anterior groin and marked weakness to flexion of the hip. On my personal review of the MRI I do notice significant fatty atrophy and potential tearing of the left sided psoas major though I do see continuity of the tendon of the iliopsoas down to the lesser trochanter.  I did discuss this with the reading radiologist who agrees about the fatty atrophy, there is no discrete tear, there is also some atrophy of the iliacus on the left side as well.    Psoas major atrophy left As above this pleasant 77 year old female has had years of pain, weakness left hip after hip arthroplasty, x-rays have consistently shown noncomplicated hip replacement on the left and right. She has had months of physical therapy for Carpenter hip. On exam she does continue to complain of severe pain with attempting to flex Carpenter left hip. This has brought about depression. 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  I also discussed that psoas major atrophy was multifactorial, very closely associated with hip osteoarthritis, and often gets better after arthroplasty, it can also be associated with L2-L4 nerve root impingement. She understands that we may not find a solution but I do think we have an answer, I will further investigate Carpenter  lumbar spine to see if we can find an etiology there.  I spent 40 minutes of total time managing this patient today, this includes chart review, face to face, and non-face to face time.  This is separate from the time spent  performing the above procedure.   ____________________________________________ Anna Carpenter. Dianah Field, M.D., ABFM., CAQSM., AME. Primary Care and Sports Medicine [place] [facility]  Adjunct Professor of [place] of [facility] of Medicine  Risk manager

## 2022-07-11 NOTE — Assessment & Plan Note (Signed)
Very pleasant 77 year old female, bilateral patellofemoral osteoarthritis, status post patella fracture on the left, ORIF, last aspiration injection left knee was early January 2024, now with recurrence of swelling and pain, she did get approval for Orthovisc that in this July 2024, we will also start Orthovisc No. 1 today, return to see me in 1 week for Orthovisc No. 2 of 4.

## 2022-07-11 NOTE — Assessment & Plan Note (Signed)
As above this pleasant 77 year old female has had years of pain, weakness left hip after hip arthroplasty, x-rays have consistently shown noncomplicated hip replacement on the left and right. She has had months of physical therapy for her hip. On exam she does continue to complain of severe pain with attempting to flex her left hip. This has brought about depression. 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  I also discussed that psoas major atrophy was multifactorial, very closely associated with hip osteoarthritis, and often gets better after arthroplasty, it can also be associated with L2-L4 nerve root impingement. She understands that we may not find a solution but I do think we have an answer, I will further investigate her lumbar spine to see if we can find an etiology there.

## 2022-07-11 NOTE — Assessment & Plan Note (Addendum)
Srah returns, she is status post left hip arthroplasty. Since then she has had persistent pain particularly with flexion of the hip, she has significant weakness, discomfort, overall good passive motion. She has had months of physical therapy without improvement, she did have some x-rays with her surgeon, all without an answer. We ordered an MRI back in 2021 that was read as normal.  She continues to have significant pain in the anterior groin and marked weakness to flexion of the hip. On my personal review of the MRI I do notice significant fatty atrophy and potential tearing of the left sided psoas major though I do see continuity of the tendon of the iliopsoas down to the lesser trochanter.  I did discuss this with the reading radiologist who agrees about the fatty atrophy, there is no discrete tear, there is also some atrophy of the iliacus on the left side as well.

## 2022-07-14 ENCOUNTER — Ambulatory Visit (INDEPENDENT_AMBULATORY_CARE_PROVIDER_SITE_OTHER): Payer: Medicare Other

## 2022-07-14 DIAGNOSIS — M7612 Psoas tendinitis, left hip: Secondary | ICD-10-CM

## 2022-07-14 DIAGNOSIS — G8929 Other chronic pain: Secondary | ICD-10-CM

## 2022-07-14 DIAGNOSIS — M545 Low back pain, unspecified: Secondary | ICD-10-CM | POA: Diagnosis not present

## 2022-07-19 ENCOUNTER — Other Ambulatory Visit (INDEPENDENT_AMBULATORY_CARE_PROVIDER_SITE_OTHER): Payer: Medicare Other

## 2022-07-19 ENCOUNTER — Ambulatory Visit (INDEPENDENT_AMBULATORY_CARE_PROVIDER_SITE_OTHER): Payer: Medicare Other | Admitting: Sports Medicine

## 2022-07-19 DIAGNOSIS — M170 Bilateral primary osteoarthritis of knee: Secondary | ICD-10-CM | POA: Diagnosis not present

## 2022-07-19 DIAGNOSIS — M7612 Psoas tendinitis, left hip: Secondary | ICD-10-CM

## 2022-07-19 MED ORDER — HYALURONAN 30 MG/2ML IX SOSY
30.0000 mg | PREFILLED_SYRINGE | Freq: Once | INTRA_ARTICULAR | Status: AC
Start: 2022-07-19 — End: 2022-07-19
  Administered 2022-07-19: 30 mg via INTRA_ARTICULAR

## 2022-07-19 NOTE — Assessment & Plan Note (Signed)
We discussed her psoas major atrophy, I explained that this is common with hip arthritis and after arthroplasty, aggressive therapy is really the only option, she does have fairly severe facet arthritis and disc disease, if she has a lot of pain this could certainly be a target for injection, please see prior notes. She will go ahead and do the aggressive hip flexor therapy for the next 2 to 3 months and then we can reevaluate.

## 2022-07-19 NOTE — Progress Notes (Signed)
    Procedures performed today:    Procedure: Real-time Ultrasound Guided injection of the left knee Device: Samsung HS60  Verbal informed consent obtained.  Time-out conducted.  Noted no overlying erythema, induration, or other signs of local infection.  Skin prepped in a sterile fashion.  Local anesthesia: Topical Ethyl chloride.  With sterile technique and under real time ultrasound guidance: Noted trace effusion, 30 mg/2 mL of OrthoVisc (sodium hyaluronate) in a prefilled syringe was injected easily into the knee through a 22-gauge needle. Completed without difficulty  Advised to call if fevers/chills, erythema, induration, drainage, or persistent bleeding.  Images permanently stored and available for review in PACS.  Impression: Technically successful ultrasound guided injection.  Independent interpretation of notes and tests performed by another provider:   None.  Brief History, Exam, Impression, and Recommendations:    Primary osteoarthritis of both knees Orthovisc No. 2 of 4 left knee, at the last visit we did steroid and Orthovisc together.  Psoas major atrophy left We discussed her psoas major atrophy, I explained that this is common with hip arthritis and after arthroplasty, aggressive therapy is really the only option, she does have fairly severe facet arthritis and disc disease, if she has a lot of pain this could certainly be a target for injection, please see prior notes. She will go ahead and do the aggressive hip flexor therapy for the next 2 to 3 months and then we can reevaluate.    ____________________________________________ Ihor Austin. Benjamin Stain, M.D., ABFM., CAQSM., AME. Primary Care and Sports Medicine [place] [facility]  Adjunct Professor of Family Medicine  [place] of [facility] [employer]  Restaurant manager, fast food

## 2022-07-19 NOTE — Assessment & Plan Note (Signed)
Orthovisc No. 2 of 4 left knee, at the last visit we did steroid and Orthovisc together.

## 2022-07-26 ENCOUNTER — Ambulatory Visit (INDEPENDENT_AMBULATORY_CARE_PROVIDER_SITE_OTHER): Payer: Medicare Other | Admitting: Sports Medicine

## 2022-07-26 ENCOUNTER — Other Ambulatory Visit (INDEPENDENT_AMBULATORY_CARE_PROVIDER_SITE_OTHER): Payer: Medicare Other

## 2022-07-26 DIAGNOSIS — M170 Bilateral primary osteoarthritis of knee: Secondary | ICD-10-CM

## 2022-07-26 NOTE — Progress Notes (Signed)
    Procedures performed today:    Procedure: Real-time Ultrasound Guided injection of the left knee Device: Samsung HS60  Verbal informed consent obtained.  Time-out conducted.  Noted no overlying erythema, induration, or other signs of local infection.  Skin prepped in a sterile fashion.  Local anesthesia: Topical Ethyl chloride.  With sterile technique and under real time ultrasound guidance: Noted trace effusion, 30 mg/2 mL of OrthoVisc (sodium hyaluronate) in a prefilled syringe was injected easily into the knee through a 22-gauge needle. Completed without difficulty  Advised to call if fevers/chills, erythema, induration, drainage, or persistent bleeding.  Images permanently stored and available for review in PACS.  Impression: Technically successful ultrasound guided injection.  Independent interpretation of notes and tests performed by another provider:   None.  Brief History, Exam, Impression, and Recommendations:    Primary osteoarthritis of both knees Orthovisc No. 3 of 4 left knee, return in 1 week for #4 of 4.    ____________________________________________ Ihor Austin. Benjamin Stain, M.D., ABFM., CAQSM., AME. Primary Care and Sports Medicine [place] [facility]  Adjunct Professor of Family Medicine  [place] of [facility] of Medicine  Restaurant manager, fast food

## 2022-07-26 NOTE — Assessment & Plan Note (Signed)
Orthovisc No. 3 of 4 left knee, return in 1 week for #4 of 4. 

## 2022-08-02 ENCOUNTER — Ambulatory Visit (INDEPENDENT_AMBULATORY_CARE_PROVIDER_SITE_OTHER): Payer: Medicare Other | Admitting: Sports Medicine

## 2022-08-02 ENCOUNTER — Other Ambulatory Visit (INDEPENDENT_AMBULATORY_CARE_PROVIDER_SITE_OTHER): Payer: Medicare Other

## 2022-08-02 DIAGNOSIS — M170 Bilateral primary osteoarthritis of knee: Secondary | ICD-10-CM | POA: Diagnosis not present

## 2022-08-02 MED ORDER — HYALURONAN 30 MG/2ML IX SOSY
30.0000 mg | PREFILLED_SYRINGE | Freq: Once | INTRA_ARTICULAR | Status: AC
Start: 2022-08-02 — End: 2022-08-02
  Administered 2022-08-02: 30 mg via INTRA_ARTICULAR

## 2022-08-02 NOTE — Assessment & Plan Note (Addendum)
Orthovisc 4 of 4 left knee, return in 4 to 6 weeks as needed. Knees are doing excellent.

## 2022-08-02 NOTE — Progress Notes (Signed)
    Procedures performed today:    Procedure: Real-time Ultrasound Guided injection of the left knee Device: Samsung HS60  Verbal informed consent obtained.  Time-out conducted.  Noted no overlying erythema, induration, or other signs of local infection.  Skin prepped in a sterile fashion.  Local anesthesia: Topical Ethyl chloride.  With sterile technique and under real time ultrasound guidance: Noted trace effusion, 30 mg/2 mL of OrthoVisc (sodium hyaluronate) in a prefilled syringe was injected easily into the knee through a 22-gauge needle. Completed without difficulty  Advised to call if fevers/chills, erythema, induration, drainage, or persistent bleeding.  Images permanently stored and available for review in PACS.  Impression: Technically successful ultrasound guided injection.  Independent interpretation of notes and tests performed by another provider:   None.  Brief History, Exam, Impression, and Recommendations:    Primary osteoarthritis of both knees Orthovisc 4 of 4 left knee, return in 4 to 6 weeks as needed. Knees are doing excellent.    ____________________________________________ Ihor Austin. Benjamin Stain, M.D., ABFM., CAQSM., AME. Primary Care and Sports Medicine [place] [facility]  Adjunct Professor of Family Medicine  [place] of [facility] of Medicine  Restaurant manager, fast food

## 2022-09-07 ENCOUNTER — Ambulatory Visit (INDEPENDENT_AMBULATORY_CARE_PROVIDER_SITE_OTHER): Payer: Medicare Other | Admitting: Sports Medicine

## 2022-09-07 DIAGNOSIS — M170 Bilateral primary osteoarthritis of knee: Secondary | ICD-10-CM | POA: Diagnosis not present

## 2022-09-07 NOTE — Progress Notes (Signed)
    Procedures performed today:    None.  Independent interpretation of notes and tests performed by another provider:   None.  Brief History, Exam, Impression, and Recommendations:    Primary osteoarthritis of both knees Recurrence of knee pain, we last finished Orthovisc 25 April of this year, she does have occasional catching, locking, at this point she is failed greater than 6 weeks of physical therapy, steroid injections, analgesics, viscosupplementation with x-ray confirmed degenerative changes, proceeding with MRI for evaluation of meniscal injury or subchondral insufficiency fracture. Treatment will depend on follow-up, if only osteoarthritis noted she will need arthroplasty, if she does have significant meniscal tearing we may consider referral for arthroscopy, I also discussed genicular artery embolization as well. Declines pain medication.    ____________________________________________ Ihor Austin. Benjamin Stain, M.D., ABFM., CAQSM., AME. Primary Care and Sports Medicine  [facility]  Adjunct Professor of Family Medicine  [place] of [facility] of Medicine  Restaurant manager, fast food

## 2022-09-07 NOTE — Assessment & Plan Note (Addendum)
Recurrence of knee pain, we last finished Orthovisc 25 April of this year, she does have occasional catching, locking, at this point she is failed greater than 6 weeks of physical therapy, steroid injections, analgesics, viscosupplementation with x-ray confirmed degenerative changes, proceeding with MRI for evaluation of meniscal injury or subchondral insufficiency fracture. Treatment will depend on follow-up, if only osteoarthritis noted she will need arthroplasty, if she does have significant meniscal tearing we may consider referral for arthroscopy, I also discussed genicular artery embolization as well. Declines pain medication.

## 2022-09-13 ENCOUNTER — Encounter: Payer: Self-pay | Admitting: Sports Medicine

## 2022-09-13 DIAGNOSIS — M170 Bilateral primary osteoarthritis of knee: Secondary | ICD-10-CM

## 2022-09-13 DIAGNOSIS — S83207A Unspecified tear of unspecified meniscus, current injury, left knee, initial encounter: Secondary | ICD-10-CM

## 2022-09-13 MED ORDER — TRAMADOL HCL 50 MG PO TABS
50.0000 mg | ORAL_TABLET | Freq: Three times a day (TID) | ORAL | 0 refills | Status: AC | PRN
Start: 2022-09-13 — End: ?

## 2022-09-13 NOTE — Addendum Note (Signed)
Addended by: Monica Becton on: 09/13/2022 04:32 PM   Modules accepted: Orders

## 2022-09-18 ENCOUNTER — Encounter (INDEPENDENT_AMBULATORY_CARE_PROVIDER_SITE_OTHER): Payer: Medicare Other | Admitting: Sports Medicine

## 2022-09-18 DIAGNOSIS — M170 Bilateral primary osteoarthritis of knee: Secondary | ICD-10-CM | POA: Diagnosis not present

## 2022-09-19 NOTE — Telephone Encounter (Signed)
I spent 5 total minutes of online digital evaluation and management services in this patient-initiated request for online care. 

## 2022-09-22 ENCOUNTER — Ambulatory Visit (INDEPENDENT_AMBULATORY_CARE_PROVIDER_SITE_OTHER): Payer: Medicare Other

## 2022-09-22 DIAGNOSIS — S83207A Unspecified tear of unspecified meniscus, current injury, left knee, initial encounter: Secondary | ICD-10-CM | POA: Diagnosis not present

## 2023-12-10 ENCOUNTER — Encounter: Payer: Self-pay | Admitting: Sports Medicine

## 2024-03-02 ENCOUNTER — Ambulatory Visit: Payer: Self-pay

## 2024-03-02 NOTE — Telephone Encounter (Signed)
 FYI Only or Action Required?: FYI only for provider: UC/ED.  Patient was last seen in primary care on 09/07/2022 by Curtis Debby PARAS, MD.  Called Nurse Triage reporting Knee Pain.  Symptoms began several days ago.  Interventions attempted: Nothing.  Symptoms are: unchanged.  Triage Disposition: See HCP Within 4 Hours (Or PCP Triage)  Patient/caregiver understands and will follow disposition?: Yes   Copied from CRM #[id number]. Topic: Clinical - Red Word Triage >> Mar 02, 2024  2:31 PM Antwanette L wrote: Red Word that prompted transfer to Nurse Triage: Pt is  pain behind the left knee Reason for Disposition  [1] Thigh or calf pain AND [2] only 1 side AND [3] present > 1 hour    Behind knee, denies calf muscle pain  Answer Assessment - Initial Assessment Questions No available appts today.  Advised UC/ED now and 911 if symptoms worsen.  1. LOCATION and RADIATION: Where is the pain located?      Left knee, when bending sharp pain back of knee, standing 2. QUALITY: What does the pain feel like?  (e.g., sharp, dull, aching, burning)     Sharp pain, palpate size of grape when standing, sitting is fine; unbale to tell if swelling, redness, or changes to skin color, hot to touch Denies calf muscle pain, sob 3. SEVERITY: How bad is the pain? What does it keep you from doing?   (Scale 1-10; or mild, moderate, severe)     8/10 4. ONSET: When did the pain start? Does it come and go, or is it there all the time?     3 days ago 5. RECURRENT: Have you had this pain before? If Yes, ask: When, and what happened then?     no 6. SETTING: Has there been any recent work, exercise or other activity that involved that part of the body?      Behind knee 7. AGGRAVATING FACTORS: What makes the knee pain worse? (e.g., walking, climbing stairs, running)     Standing, walking , bending and getting out chairworse 8. ASSOCIATED SYMPTOMS: Is there any swelling or redness of the  knee?     unsue 9. OTHER SYMPTOMS: Do you have any other symptoms? (e.g., calf pain, chest pain, difficulty breathing, fever)     Denies calf pain, chest pain, diff breathing, fever  Protocols used: Knee Pain-A-AH
